# Patient Record
Sex: Female | Born: 1985 | Race: White | Hispanic: No | Marital: Married | Smoking: Never smoker
Health system: Southern US, Community
[De-identification: ages and names within clinical notes are randomized; demographics above are authoritative.]

## PROBLEM LIST (undated history)

## (undated) ENCOUNTER — Inpatient Hospital Stay (HOSPITAL_COMMUNITY): Payer: Self-pay

## (undated) DIAGNOSIS — Z789 Other specified health status: Secondary | ICD-10-CM

## (undated) DIAGNOSIS — I8390 Asymptomatic varicose veins of unspecified lower extremity: Secondary | ICD-10-CM

## (undated) DIAGNOSIS — Z8619 Personal history of other infectious and parasitic diseases: Secondary | ICD-10-CM

## (undated) HISTORY — DX: Asymptomatic varicose veins of unspecified lower extremity: I83.90

## (undated) HISTORY — PX: NO PAST SURGERIES: SHX2092

## (undated) HISTORY — DX: Personal history of other infectious and parasitic diseases: Z86.19

## (undated) HISTORY — PX: OTHER SURGICAL HISTORY: SHX169

---

## 2011-08-05 LAB — OB RESULTS CONSOLE ABO/RH

## 2011-08-05 LAB — OB RESULTS CONSOLE GC/CHLAMYDIA: Gonorrhea: NEGATIVE

## 2011-08-05 LAB — OB RESULTS CONSOLE HEPATITIS B SURFACE ANTIGEN: Hepatitis B Surface Ag: NEGATIVE

## 2011-08-05 LAB — OB RESULTS CONSOLE ANTIBODY SCREEN: Antibody Screen: NEGATIVE

## 2011-08-05 LAB — OB RESULTS CONSOLE RUBELLA ANTIBODY, IGM: Rubella: IMMUNE

## 2011-08-09 ENCOUNTER — Inpatient Hospital Stay (HOSPITAL_COMMUNITY)
Admission: AD | Admit: 2011-08-09 | Discharge: 2011-08-09 | Disposition: A | Discharge status: Home / Self Care | Payer: 59 | Source: Ambulatory Visit | Attending: Obstetrics and Gynecology | Admitting: Obstetrics and Gynecology

## 2011-08-09 ENCOUNTER — Encounter (HOSPITAL_COMMUNITY): Payer: Self-pay | Admitting: *Deleted

## 2011-08-09 ENCOUNTER — Inpatient Hospital Stay (HOSPITAL_COMMUNITY): Payer: 59

## 2011-08-09 DIAGNOSIS — O209 Hemorrhage in early pregnancy, unspecified: Secondary | ICD-10-CM | POA: Insufficient documentation

## 2011-08-09 HISTORY — DX: Other specified health status: Z78.9

## 2011-08-09 NOTE — MAU Note (Signed)
I woke up and my underwear was wet. Went to BR and filled the toilet up with blood. I had twins and I lost one. No pain

## 2011-08-09 NOTE — MAU Provider Note (Signed)
  History     CSN: [id number]  Arrival date and time: 08/09/11 6440   None     Chief Complaint  Patient presents with  . Vaginal Bleeding   HPI This is a 26 y.o. female at [redacted]w[redacted]d who presents with report of waking up this morning feeling wet. Went to bathroom and filled toilet with blood. Not much cramping, mild cramping just started. States originally had twins via In Vitro, but lost one at 8 weeks.   OB History    Grav Para Term Preterm Abortions TAB SAB Ect Mult Living   1               Past Medical History  Diagnosis Date  . No pertinent past medical history     No past surgical history on file.  No family history on file.  History  Substance Use Topics  . Smoking status: Never Smoker   . Smokeless tobacco: Not on file  . Alcohol Use: No    Allergies: Allergies not on file  No prescriptions prior to admission    ROS Denies fever, nausea or vomiting. + vaginal bleeding and cramping  Physical Exam   Blood pressure 121/59, pulse 99, temperature 98.5 F (36.9 C), temperature source Oral, resp. rate 20, height 5\' 3"  (1.6 m), weight 157 lb 9.6 oz (71.487 kg).  Physical Exam  Constitutional: She is oriented to person, place, and time. She appears well-developed and well-nourished. She appears distressed (worried).  Cardiovascular: Normal rate.   Respiratory: Effort normal.  GI: Soft. There is no tenderness.  Genitourinary: Vaginal discharge (spotting red blood, pelvic deferred per Dr Tenny Craw) found.  Musculoskeletal: Normal range of motion.  Neurological: She is alert and oriented to person, place, and time.  Skin: Skin is warm and dry.  Psychiatric: She has a normal mood and affect.   US Ob Comp Less 14 Wks  08/09/2011  *RADIOLOGY REPORT*  Clinical Data: Vaginal bleeding.  TWIN OBSTETRICAL ULTRASOUND <14 WKS:  Comparison:  None.  Findings:  A dichorionic twin IUP is seen.  TWIN A Heart Rate: 160 bpm  CRL:  48 mm       11w    5d         [place] EDC: 02/23/2012  TWIN  B Heart Rate: Absent  CRL:  21 mm       8w    5 d  Maternal uterus/adnexae: A small subchorionic hemorrhage is noted.  No fibroids identified.  Both ovaries are normal in appearance. No adnexal mass or free fluid identified.  IMPRESSION:  1.  Dichorionic twin IUP, with demise of twin B.  Single living IUP with estimated gestational age of [redacted] weeks 5 days and [place] EDC of 02/23/2012. 2.  Small subchronic hemorrhage. 3.  No adnexal mass or free fluid identified.  Original Report Authenticated By: Danae Orleans, M.D.   MAU Course  Procedures  Assessment and Plan  A:  Twin IUP at [redacted]w[redacted]d with known demise of Twin B       First Trimester bleeding       Small subchorionic hemorrhage P:  Discussed with Dr Tenny Craw      Pt reassured      Bleeding precautions      Followup in office as scheduled  [facility] 08/09/2011, 7:28 AM

## 2012-01-14 NOTE — L&D Delivery Note (Signed)
Patient was C/C/+2 and pushed for 20 minutes with epidural.   NSVD  female infant, Apgars 9,9, weight P.   The patient had one midline perineal partial 3rd degree laceration repaired with 2-0 vicryl R. Fundus was firm. EBL was expected. Placenta was delivered intact. Vagina was clear.  Baby was vigorous to bedside.  Miranda Hensley A

## 2012-02-24 ENCOUNTER — Encounter (HOSPITAL_COMMUNITY): Payer: Self-pay | Admitting: *Deleted

## 2012-02-24 ENCOUNTER — Inpatient Hospital Stay (HOSPITAL_COMMUNITY): Payer: 59 | Admitting: Anesthesiology

## 2012-02-24 ENCOUNTER — Inpatient Hospital Stay (HOSPITAL_COMMUNITY)
Admission: AD | Admit: 2012-02-24 | Discharge: 2012-02-26 | DRG: 775 | Disposition: A | Discharge status: Home / Self Care | Payer: 59 | Source: Ambulatory Visit | Attending: Obstetrics and Gynecology | Admitting: Obstetrics and Gynecology

## 2012-02-24 ENCOUNTER — Telehealth (HOSPITAL_COMMUNITY): Payer: Self-pay | Admitting: *Deleted

## 2012-02-24 ENCOUNTER — Encounter (HOSPITAL_COMMUNITY): Payer: Self-pay | Admitting: Anesthesiology

## 2012-02-24 DIAGNOSIS — O2200 Varicose veins of lower extremity in pregnancy, unspecified trimester: Secondary | ICD-10-CM | POA: Diagnosis present

## 2012-02-24 DIAGNOSIS — Z3480 Encounter for supervision of other normal pregnancy, unspecified trimester: Secondary | ICD-10-CM

## 2012-02-24 HISTORY — DX: Other specified health status: Z78.9

## 2012-02-24 LAB — CBC
HCT: 34.1 % — ABNORMAL LOW (ref 36.0–46.0)
MCH: 31.6 pg (ref 26.0–34.0)
MCHC: 34.3 g/dL (ref 30.0–36.0)
MCV: 92.2 fL (ref 78.0–100.0)
Platelets: 232 10*3/uL (ref 150–400)
RDW: 13.3 % (ref 11.5–15.5)
WBC: 13.4 10*3/uL — ABNORMAL HIGH (ref 4.0–10.5)

## 2012-02-24 LAB — COMPREHENSIVE METABOLIC PANEL
ALT: 11 U/L (ref 0–35)
AST: 16 U/L (ref 0–37)
Albumin: 2.8 g/dL — ABNORMAL LOW (ref 3.5–5.2)
Calcium: 9.6 mg/dL (ref 8.4–10.5)
Chloride: 99 mEq/L (ref 96–112)
Creatinine, Ser: 0.48 mg/dL — ABNORMAL LOW (ref 0.50–1.10)
Sodium: 135 mEq/L (ref 135–145)

## 2012-02-24 MED ORDER — OXYCODONE-ACETAMINOPHEN 5-325 MG PO TABS: 1.0000 | ORAL_TABLET | ORAL | Status: DC | PRN

## 2012-02-24 MED ORDER — IBUPROFEN 600 MG PO TABS: 600.0000 mg | ORAL_TABLET | Freq: Four times a day (QID) | ORAL | Status: DC | PRN

## 2012-02-24 MED ORDER — OXYTOCIN BOLUS FROM INFUSION: 500.0000 mL | INTRAVENOUS | Status: DC

## 2012-02-24 MED ORDER — LIDOCAINE HCL (PF) 1 % IJ SOLN
INTRAMUSCULAR | Status: DC | PRN
  Administered 2012-02-24 (×5): 4 mL

## 2012-02-24 MED ORDER — LACTATED RINGERS IV SOLN: 500.0000 mL | INTRAVENOUS | Status: DC | PRN

## 2012-02-24 MED ORDER — BUTORPHANOL TARTRATE 1 MG/ML IJ SOLN
1.0000 mg | INTRAMUSCULAR | Status: DC | PRN
  Filled 2012-02-24: qty 1

## 2012-02-24 MED ORDER — PHENYLEPHRINE 40 MCG/ML (10ML) SYRINGE FOR IV PUSH (FOR BLOOD PRESSURE SUPPORT): 80.0000 ug | PREFILLED_SYRINGE | INTRAVENOUS | Status: DC | PRN

## 2012-02-24 MED ORDER — DIPHENHYDRAMINE HCL 50 MG/ML IJ SOLN: 12.5000 mg | INTRAMUSCULAR | Status: DC | PRN

## 2012-02-24 MED ORDER — EPHEDRINE 5 MG/ML INJ: 10.0000 mg | INTRAVENOUS | Status: DC | PRN

## 2012-02-24 MED ORDER — LACTATED RINGERS IV SOLN
500.0000 mL | Freq: Once | INTRAVENOUS | Status: AC
  Administered 2012-02-24: 500 mL via INTRAVENOUS

## 2012-02-24 MED ORDER — OXYTOCIN 40 UNITS IN LACTATED RINGERS INFUSION - SIMPLE MED
62.5000 mL/h | INTRAVENOUS | Status: DC
  Administered 2012-02-24: 62.5 mL/h via INTRAVENOUS
  Filled 2012-02-24: qty 1000

## 2012-02-24 MED ORDER — SODIUM BICARBONATE 8.4 % IV SOLN
INTRAVENOUS | Status: DC | PRN
  Administered 2012-02-24: 5 mL via EPIDURAL

## 2012-02-24 MED ORDER — CITRIC ACID-SODIUM CITRATE 334-500 MG/5ML PO SOLN: 30.0000 mL | ORAL | Status: DC | PRN

## 2012-02-24 MED ORDER — LACTATED RINGERS IV SOLN
INTRAVENOUS | Status: DC
  Administered 2012-02-24 (×2): via INTRAVENOUS

## 2012-02-24 MED ORDER — EPHEDRINE 5 MG/ML INJ
10.0000 mg | INTRAVENOUS | Status: DC | PRN
  Filled 2012-02-24: qty 4

## 2012-02-24 MED ORDER — FENTANYL CITRATE 0.05 MG/ML IJ SOLN
INTRAMUSCULAR | Status: AC
  Administered 2012-02-24: 100 ug via EPIDURAL
  Filled 2012-02-24: qty 2

## 2012-02-24 MED ORDER — ACETAMINOPHEN 325 MG PO TABS: 650.0000 mg | ORAL_TABLET | ORAL | Status: DC | PRN

## 2012-02-24 MED ORDER — FENTANYL CITRATE 0.05 MG/ML IJ SOLN
INTRAMUSCULAR | Status: AC
  Filled 2012-02-24: qty 2

## 2012-02-24 MED ORDER — ONDANSETRON HCL 4 MG/2ML IJ SOLN: 4.0000 mg | Freq: Four times a day (QID) | INTRAMUSCULAR | Status: DC | PRN

## 2012-02-24 MED ORDER — LIDOCAINE HCL (PF) 1 % IJ SOLN
30.0000 mL | INTRAMUSCULAR | Status: DC | PRN
  Filled 2012-02-24: qty 30

## 2012-02-24 MED ORDER — FENTANYL 2.5 MCG/ML BUPIVACAINE 1/10 % EPIDURAL INFUSION (WH - ANES)
14.0000 mL/h | INTRAMUSCULAR | Status: DC
  Administered 2012-02-24: 14 mL/h via EPIDURAL
  Filled 2012-02-24: qty 125

## 2012-02-24 MED ORDER — PHENYLEPHRINE 40 MCG/ML (10ML) SYRINGE FOR IV PUSH (FOR BLOOD PRESSURE SUPPORT)
80.0000 ug | PREFILLED_SYRINGE | INTRAVENOUS | Status: DC | PRN
  Filled 2012-02-24: qty 5

## 2012-02-24 MED ORDER — FLEET ENEMA 7-19 GM/118ML RE ENEM: 1.0000 | ENEMA | RECTAL | Status: DC | PRN

## 2012-02-24 NOTE — Progress Notes (Signed)
Message left for Dr Henderson Cloud to return call to [facility] for report on labor ck

## 2012-02-24 NOTE — Telephone Encounter (Signed)
Preadmission screen Pt states she is having ucs and bleeding.  Told pt to come to MAU ASAP

## 2012-02-24 NOTE — H&P (Signed)
27 y.o. [redacted]w[redacted]d  G1P0 comes in c/o labor.  Otherwise has good fetal movement and no bleeding.  Past Medical History  Diagnosis Date  . No pertinent past medical history   . Newborn product of IVF pregnancy   . Hx of varicella   . Varicose veins   . Medical history non-contributory     Past Surgical History  Procedure Laterality Date  . No past surgeries    . Ivf      OB History   Grav Para Term Preterm Abortions TAB SAB Ect Mult Living   1              # Outc Date GA Lbr Len/2nd Wgt Sex Del Anes PTL Lv   1 CUR               History   Social History  . Marital Status: Married    Spouse Name: N/Hensley    Number of Children: N/Hensley  . Years of Education: N/Hensley   Occupational History  . Not on file.   Social History Main Topics  . Smoking status: Never Smoker   . Smokeless tobacco: Not on file  . Alcohol Use: No  . Drug Use: No  . Sexually Active: No   Other Topics Concern  . Not on file   Social History Narrative  . No narrative on file   Amoxicillin and Cephalexin    Prenatal Transfer Tool  Maternal Diabetes: No Genetic Screening: Declined Maternal Ultrasounds/Referrals: Abnormal:  Findings:   Other:twin preg initially- B demise at 9 weeks Fetal Ultrasounds or other Referrals:  None Maternal Substance Abuse:  No Significant Maternal Medications:  None Significant Maternal Lab Results: None  Other ZOX:WRUE    Filed Vitals:   02/24/12 1833  BP:   Pulse: 90  Temp:   Resp:      Lungs/Cor:  NAD Abdomen:  soft, gravid Ex:  no cords, erythema SVE:  5-6/80/-2, AROM clear FHTs:  120, good STV, NST R Toco:  q2   Hensley/P   Labor.  GBS neg.  Miranda Hensley

## 2012-02-24 NOTE — Progress Notes (Signed)
efm applied and assessing 

## 2012-02-24 NOTE — Anesthesia Preprocedure Evaluation (Signed)

## 2012-02-24 NOTE — Progress Notes (Signed)
Report called to [facility] in [facility].

## 2012-02-24 NOTE — Progress Notes (Signed)
No blood noted on perineum now. Pt states only sees bloody mucous when she wipes

## 2012-02-24 NOTE — Progress Notes (Signed)
Some pedal edema but not extreme

## 2012-02-24 NOTE — Anesthesia Procedure Notes (Signed)
Epidural Patient location during procedure: OB Start time: 02/24/2012 5:59 PM  Staffing Performed by: anesthesiologist   Preanesthetic Checklist Completed: patient identified, site marked, surgical consent, pre-op evaluation, timeout performed, IV checked, risks and benefits discussed and monitors and equipment checked  Epidural Patient position: sitting Prep: site prepped and draped and DuraPrep Patient monitoring: continuous pulse ox and blood pressure Approach: midline Injection technique: LOR air  Needle:  Needle type: Tuohy  Needle gauge: 17 G Needle length: 9 cm and 9 Needle insertion depth: 5 cm cm Catheter type: closed end flexible Catheter size: 19 Gauge Catheter at skin depth: 10 cm Test dose: negative  Assessment Events: blood not aspirated, injection not painful, no injection resistance, negative IV test and no paresthesia  Additional Notes Discussed risk of headache, infection, bleeding, nerve injury and failed or incomplete block.  Patient voices understanding and wishes to proceed.  Epidural placed easily on first attempt.  No paresthesia.  Patient tolerated procedure well with no apparent complications.  Jasmine December, MD Reason for block:procedure for pain

## 2012-02-24 NOTE — MAU Note (Signed)
Contractions since last pm. Able to sleep until 0300. Had some vag bleeding about 0930 today. Was first mucous and then a blob and now back to mucous.

## 2012-02-24 NOTE — Progress Notes (Signed)
Dr Henderson Cloud notified of pt's admission and status. Aware of ctx pattern, sve, elevated B/Ps. Will admit to [facility]

## 2012-02-25 ENCOUNTER — Encounter (HOSPITAL_COMMUNITY): Payer: Self-pay | Admitting: *Deleted

## 2012-02-25 LAB — CBC
MCH: 32.4 pg (ref 26.0–34.0)
MCHC: 34.9 g/dL (ref 30.0–36.0)
Platelets: 196 10*3/uL (ref 150–400)
RBC: 2.81 MIL/uL — ABNORMAL LOW (ref 3.87–5.11)

## 2012-02-25 MED ORDER — INFLUENZA VIRUS VACC SPLIT PF IM SUSP: 0.5000 mL | INTRAMUSCULAR | Status: DC

## 2012-02-25 MED ORDER — SENNOSIDES-DOCUSATE SODIUM 8.6-50 MG PO TABS
2.0000 | ORAL_TABLET | Freq: Every day | ORAL | Status: DC
  Administered 2012-02-25 (×2): 2 via ORAL

## 2012-02-25 MED ORDER — SODIUM CHLORIDE 0.9 % IJ SOLN
3.0000 mL | Freq: Two times a day (BID) | INTRAMUSCULAR | Status: DC
  Administered 2012-02-25: 3 mL via INTRAVENOUS

## 2012-02-25 MED ORDER — SODIUM CHLORIDE 0.9 % IV SOLN: 250.0000 mL | INTRAVENOUS | Status: DC | PRN

## 2012-02-25 MED ORDER — ONDANSETRON HCL 4 MG/2ML IJ SOLN: 4.0000 mg | INTRAMUSCULAR | Status: DC | PRN

## 2012-02-25 MED ORDER — FERROUS SULFATE 325 (65 FE) MG PO TABS
325.0000 mg | ORAL_TABLET | Freq: Two times a day (BID) | ORAL | Status: DC
  Administered 2012-02-25 – 2012-02-26 (×3): 325 mg via ORAL
  Filled 2012-02-25 (×3): qty 1

## 2012-02-25 MED ORDER — WITCH HAZEL-GLYCERIN EX PADS
1.0000 "application " | MEDICATED_PAD | CUTANEOUS | Status: DC | PRN
  Administered 2012-02-25 – 2012-02-26 (×2): 1 via TOPICAL

## 2012-02-25 MED ORDER — MEASLES, MUMPS & RUBELLA VAC ~~LOC~~ INJ
0.5000 mL | INJECTION | Freq: Once | SUBCUTANEOUS | Status: DC
  Filled 2012-02-25: qty 0.5

## 2012-02-25 MED ORDER — BENZOCAINE-MENTHOL 20-0.5 % EX AERO
1.0000 "application " | INHALATION_SPRAY | CUTANEOUS | Status: DC | PRN
  Administered 2012-02-25 – 2012-02-26 (×2): 1 via TOPICAL
  Filled 2012-02-25 (×2): qty 56

## 2012-02-25 MED ORDER — LANOLIN HYDROUS EX OINT: TOPICAL_OINTMENT | CUTANEOUS | Status: DC | PRN

## 2012-02-25 MED ORDER — ONDANSETRON HCL 4 MG PO TABS: 4.0000 mg | ORAL_TABLET | ORAL | Status: DC | PRN

## 2012-02-25 MED ORDER — OXYCODONE-ACETAMINOPHEN 5-325 MG PO TABS
1.0000 | ORAL_TABLET | ORAL | Status: DC | PRN
  Administered 2012-02-25 – 2012-02-26 (×4): 1 via ORAL
  Filled 2012-02-25 (×4): qty 1

## 2012-02-25 MED ORDER — MAGNESIUM HYDROXIDE 400 MG/5ML PO SUSP: 30.0000 mL | ORAL | Status: DC | PRN

## 2012-02-25 MED ORDER — SODIUM CHLORIDE 0.9 % IJ SOLN: 3.0000 mL | INTRAMUSCULAR | Status: DC | PRN

## 2012-02-25 MED ORDER — SIMETHICONE 80 MG PO CHEW: 80.0000 mg | CHEWABLE_TABLET | ORAL | Status: DC | PRN

## 2012-02-25 MED ORDER — IBUPROFEN 800 MG PO TABS
800.0000 mg | ORAL_TABLET | Freq: Three times a day (TID) | ORAL | Status: DC
  Administered 2012-02-25 (×2): 800 mg via ORAL
  Administered 2012-02-26: 600 mg via ORAL
  Administered 2012-02-26: 800 mg via ORAL
  Filled 2012-02-25 (×4): qty 1

## 2012-02-25 MED ORDER — PRENATAL MULTIVITAMIN CH
1.0000 | ORAL_TABLET | Freq: Every day | ORAL | Status: DC
  Administered 2012-02-25 – 2012-02-26 (×2): 1 via ORAL
  Filled 2012-02-25 (×2): qty 1

## 2012-02-25 MED ORDER — METHYLERGONOVINE MALEATE 0.2 MG/ML IJ SOLN: 0.2000 mg | INTRAMUSCULAR | Status: DC | PRN

## 2012-02-25 MED ORDER — DIPHENHYDRAMINE HCL 25 MG PO CAPS: 25.0000 mg | ORAL_CAPSULE | Freq: Four times a day (QID) | ORAL | Status: DC | PRN

## 2012-02-25 MED ORDER — TETANUS-DIPHTH-ACELL PERTUSSIS 5-2.5-18.5 LF-MCG/0.5 IM SUSP
0.5000 mL | Freq: Once | INTRAMUSCULAR | Status: AC
  Administered 2012-02-25: 0.5 mL via INTRAMUSCULAR
  Filled 2012-02-25: qty 0.5

## 2012-02-25 MED ORDER — ZOLPIDEM TARTRATE 5 MG PO TABS: 5.0000 mg | ORAL_TABLET | Freq: Every evening | ORAL | Status: DC | PRN

## 2012-02-25 MED ORDER — DIBUCAINE 1 % RE OINT
1.0000 "application " | TOPICAL_OINTMENT | RECTAL | Status: DC | PRN
  Administered 2012-02-25 – 2012-02-26 (×2): 1 via RECTAL
  Filled 2012-02-25 (×2): qty 28

## 2012-02-25 MED ORDER — METHYLERGONOVINE MALEATE 0.2 MG PO TABS: 0.2000 mg | ORAL_TABLET | ORAL | Status: DC | PRN

## 2012-02-25 NOTE — Anesthesia Postprocedure Evaluation (Signed)
  Anesthesia Post-op Note  Patient: Miranda Hensley  Procedure(s) Performed: * No procedures listed *  Patient Location: Mother/Baby  Anesthesia Type:Epidural  Level of Consciousness: awake, alert  and oriented  Airway and Oxygen Therapy: Patient Spontanous Breathing  Post-op Pain: mild  Post-op Assessment: Post-op Vital signs reviewed, Patient's Cardiovascular Status Stable, No headache, No backache, No residual numbness and No residual motor weakness  Post-op Vital Signs: Reviewed and stable  Complications: No apparent anesthesia complications

## 2012-02-25 NOTE — Progress Notes (Signed)
Patient is eating, ambulating, voiding.  Pain control is good.  Appropriate lochia.  No complaints.  Filed Vitals:   02/25/12 0016 02/25/12 0054 02/25/12 0200 02/25/12 0627  BP: 131/62 107/75 119/72 103/61  Pulse: 116 109 110 98  Temp:  100 F (37.8 C) 99 F (37.2 C) 98.3 F (36.8 C)  TempSrc:  Oral Oral Oral  Resp: 20 18 18 18   Height:      Weight:      SpO2:  98% 97% 97%    Fundus firm Perineum without swelling. No CT  Lab Results  Component Value Date   WBC 17.7* 02/25/2012   HGB 9.1* 02/25/2012   HCT 26.1* 02/25/2012   MCV 92.9 02/25/2012   PLT 196 02/25/2012    A/Positive/-- (07/23 0000)  A/P Post partum day 1.  Routine care.  Expect d/c 2/13.    Philip Aspen

## 2012-02-26 ENCOUNTER — Telehealth (HOSPITAL_COMMUNITY): Payer: Self-pay | Admitting: *Deleted

## 2012-02-26 MED ORDER — OXYCODONE-ACETAMINOPHEN 5-325 MG PO TABS: 1.0000 | ORAL_TABLET | ORAL | Status: DC | PRN

## 2012-02-26 MED ORDER — FERROUS SULFATE 325 (65 FE) MG PO TABS: 325.0000 mg | ORAL_TABLET | Freq: Two times a day (BID) | ORAL | Status: DC

## 2012-02-26 NOTE — Anesthesia Postprocedure Evaluation (Signed)
  Anesthesia Post-op Note  Asked by Dr Dareen Piano to evaluate patient for complaint of numb/weak right leg following SVD with epidural on 02/24/12.  Patient is being discharged home today.  She had epidural for labor on 02/24/12.  During labor, she had pain in right hip (suspected it was bone-on-bone pain from baby's position, did not respond to position changes or PCEAs, but did improve with epidural fentanyl and 10 ml 2% lidocaine).  She eventually became complete and pushed for 15 minutes.  Today, she is preparing to go home.  When I came to evaluate her she is standing and walking around her room.  She reports numbness and weakness of her right anterior thigh only.  She is able to ambulate normally.  She has no bowel or bladder dysfunction.  Left leg is unaffected.  Right posterior thigh and lower leg are unaffected.  I reassured her that this is likely femoral nerve compression from labor/birth and will resolve over the next few days/ weeks.  There is no need for further intervention.  Patient satisfied with explanation and anesthesia care.  Jasmine December, MD

## 2012-02-26 NOTE — Progress Notes (Signed)
PPD#2 Pt without c/o. Lochia-mild VSSAF IMP/ stable Plan/ Will discharge.

## 2012-02-26 NOTE — Discharge Summary (Signed)
Obstetric Discharge Summary Reason for Admission: onset of labor Prenatal Procedures: ultrasound Intrapartum Procedures: spontaneous vaginal delivery Postpartum Procedures: none Complications-Operative and Postpartum: 3 degree perineal laceration Hemoglobin  Date Value Range Status  02/25/2012 9.1* 12.0 - 15.0 g/dL Final     DELTA CHECK NOTED     REPEATED TO VERIFY     HCT  Date Value Range Status  02/25/2012 26.1* 36.0 - 46.0 % Final    Physical Exam:  General: alert Lochia: appropriate Uterine Fundus: firm   Discharge Diagnoses: Term Pregnancy-delivered  Discharge Information: Date: 02/26/2012 Activity: pelvic rest Diet: routine Medications: PNV, Ibuprofen, Colace, Iron and Percocet Condition: stable Instructions: refer to practice specific booklet Discharge to: home Follow-up Information   Follow up with HORVATH,MICHELLE A, MD. Schedule an appointment as soon as possible for a visit in 4 weeks.   Contact information:   [address] [place] [address] [phone]       Newborn Data: Live born female  Birth Weight: 7 lb 11.5 oz (3500 g) APGAR: 9, 9  Home with mother.  Yusuf Yu E 02/26/2012, 7:39 AM

## 2012-02-26 NOTE — Telephone Encounter (Signed)
Resolve episode 

## 2012-03-01 ENCOUNTER — Inpatient Hospital Stay (HOSPITAL_COMMUNITY): Admission: RE | Admit: 2012-03-01 | Payer: 59 | Source: Ambulatory Visit

## 2013-11-14 ENCOUNTER — Encounter (HOSPITAL_COMMUNITY): Payer: Self-pay | Admitting: *Deleted

## 2013-11-26 ENCOUNTER — Inpatient Hospital Stay (HOSPITAL_COMMUNITY): Payer: 59

## 2013-11-26 ENCOUNTER — Ambulatory Visit (HOSPITAL_COMMUNITY)
Admission: AD | Admit: 2013-11-26 | Discharge: 2013-11-26 | Disposition: A | Discharge status: Home / Self Care | Payer: 59 | Source: Ambulatory Visit | Attending: Obstetrics | Admitting: Obstetrics

## 2013-11-26 ENCOUNTER — Encounter (HOSPITAL_COMMUNITY): Payer: Self-pay | Admitting: *Deleted

## 2013-11-26 ENCOUNTER — Encounter (HOSPITAL_COMMUNITY)
Admission: AD | Disposition: A | Discharge status: Home / Self Care | Payer: Self-pay | Source: Ambulatory Visit | Attending: Obstetrics

## 2013-11-26 DIAGNOSIS — M25512 Pain in left shoulder: Secondary | ICD-10-CM | POA: Insufficient documentation

## 2013-11-26 DIAGNOSIS — O001 Tubal pregnancy: Secondary | ICD-10-CM | POA: Insufficient documentation

## 2013-11-26 DIAGNOSIS — R102 Pelvic and perineal pain: Secondary | ICD-10-CM

## 2013-11-26 DIAGNOSIS — O26891 Other specified pregnancy related conditions, first trimester: Secondary | ICD-10-CM

## 2013-11-26 HISTORY — PX: DIAGNOSTIC LAPAROSCOPY WITH REMOVAL OF ECTOPIC PREGNANCY: SHX6449

## 2013-11-26 LAB — URINALYSIS, ROUTINE W REFLEX MICROSCOPIC
BILIRUBIN URINE: NEGATIVE
GLUCOSE, UA: NEGATIVE mg/dL
Ketones, ur: NEGATIVE mg/dL
Nitrite: NEGATIVE
Protein, ur: NEGATIVE mg/dL
SPECIFIC GRAVITY, URINE: 1.01 (ref 1.005–1.030)
UROBILINOGEN UA: 0.2 mg/dL (ref 0.0–1.0)
pH: 6.5 (ref 5.0–8.0)

## 2013-11-26 LAB — CBC
HEMATOCRIT: 35.6 % — AB (ref 36.0–46.0)
HEMOGLOBIN: 12.6 g/dL (ref 12.0–15.0)
MCH: 31.6 pg (ref 26.0–34.0)
MCHC: 35.4 g/dL (ref 30.0–36.0)
MCV: 89.2 fL (ref 78.0–100.0)
Platelets: 288 10*3/uL (ref 150–400)
RBC: 3.99 MIL/uL (ref 3.87–5.11)
RDW: 12.2 % (ref 11.5–15.5)
WBC: 11.1 10*3/uL — AB (ref 4.0–10.5)

## 2013-11-26 LAB — ABO/RH: ABO/RH(D): A POS

## 2013-11-26 LAB — TYPE AND SCREEN
ABO/RH(D): A POS
Antibody Screen: NEGATIVE

## 2013-11-26 LAB — HCG, QUANTITATIVE, PREGNANCY: hCG, Beta Chain, Quant, S: 82447 m[IU]/mL — ABNORMAL HIGH (ref <5)

## 2013-11-26 LAB — URINE MICROSCOPIC-ADD ON

## 2013-11-26 LAB — POCT PREGNANCY, URINE: PREG TEST UR: POSITIVE — AB

## 2013-11-26 SURGERY — LAPAROSCOPY, WITH ECTOPIC PREGNANCY SURGICAL TREATMENT
Anesthesia: General | Site: Abdomen

## 2013-11-26 MED ORDER — ONDANSETRON HCL 4 MG/2ML IJ SOLN
INTRAMUSCULAR | Status: AC
  Filled 2013-11-26: qty 2

## 2013-11-26 MED ORDER — FENTANYL CITRATE 0.05 MG/ML IJ SOLN
INTRAMUSCULAR | Status: DC | PRN
Start: 2013-11-26 — End: 2013-11-26
  Administered 2013-11-26: 50 ug via INTRAVENOUS
  Administered 2013-11-26 (×2): 100 ug via INTRAVENOUS

## 2013-11-26 MED ORDER — OXYCODONE HCL 5 MG PO TABS: 5.0000 mg | ORAL_TABLET | Freq: Once | ORAL | Status: DC | PRN

## 2013-11-26 MED ORDER — LIDOCAINE HCL (CARDIAC) 20 MG/ML IV SOLN
INTRAVENOUS | Status: DC | PRN
  Administered 2013-11-26: 20 mg via INTRAVENOUS

## 2013-11-26 MED ORDER — ROCURONIUM BROMIDE 100 MG/10ML IV SOLN
INTRAVENOUS | Status: AC
  Filled 2013-11-26: qty 1

## 2013-11-26 MED ORDER — ACETAMINOPHEN 10 MG/ML IV SOLN
1000.0000 mg | Freq: Four times a day (QID) | INTRAVENOUS | Status: DC
  Administered 2013-11-26: 1000 mg via INTRAVENOUS
  Filled 2013-11-26: qty 100

## 2013-11-26 MED ORDER — OXYCODONE HCL 5 MG PO TABS: 5.0000 mg | ORAL_TABLET | Freq: Four times a day (QID) | ORAL | Status: DC | PRN

## 2013-11-26 MED ORDER — HYDROMORPHONE HCL 1 MG/ML IJ SOLN
0.2500 mg | INTRAMUSCULAR | Status: DC | PRN
  Administered 2013-11-26: 0.25 mg via INTRAVENOUS

## 2013-11-26 MED ORDER — LACTATED RINGERS IV SOLN
INTRAVENOUS | Status: DC | PRN
  Administered 2013-11-26 (×2): via INTRAVENOUS

## 2013-11-26 MED ORDER — PROPOFOL 10 MG/ML IV BOLUS
INTRAVENOUS | Status: DC | PRN
  Administered 2013-11-26: 200 mg via INTRAVENOUS

## 2013-11-26 MED ORDER — SUCCINYLCHOLINE CHLORIDE 20 MG/ML IJ SOLN
INTRAMUSCULAR | Status: DC | PRN
Start: 2013-11-26 — End: 2013-11-26
  Administered 2013-11-26: 100 mg via INTRAVENOUS

## 2013-11-26 MED ORDER — GLYCOPYRROLATE 0.2 MG/ML IJ SOLN
INTRAMUSCULAR | Status: AC
  Filled 2013-11-26: qty 4

## 2013-11-26 MED ORDER — GLYCOPYRROLATE 0.2 MG/ML IJ SOLN
INTRAMUSCULAR | Status: DC | PRN
  Administered 2013-11-26: .8 mg via INTRAVENOUS

## 2013-11-26 MED ORDER — ONDANSETRON HCL 4 MG/2ML IJ SOLN
INTRAMUSCULAR | Status: DC | PRN
  Administered 2013-11-26: 4 mg via INTRAVENOUS

## 2013-11-26 MED ORDER — ROCURONIUM BROMIDE 100 MG/10ML IV SOLN
INTRAVENOUS | Status: DC | PRN
Start: 2013-11-26 — End: 2013-11-26
  Administered 2013-11-26: 10 mg via INTRAVENOUS
  Administered 2013-11-26: 20 mg via INTRAVENOUS

## 2013-11-26 MED ORDER — NEOSTIGMINE METHYLSULFATE 10 MG/10ML IV SOLN
INTRAVENOUS | Status: AC
  Filled 2013-11-26: qty 1

## 2013-11-26 MED ORDER — PROMETHAZINE HCL 25 MG/ML IJ SOLN: 6.2500 mg | INTRAMUSCULAR | Status: DC | PRN

## 2013-11-26 MED ORDER — SUCCINYLCHOLINE CHLORIDE 20 MG/ML IJ SOLN
INTRAMUSCULAR | Status: AC
  Filled 2013-11-26: qty 10

## 2013-11-26 MED ORDER — BUPIVACAINE HCL 0.25 % IJ SOLN
INTRAMUSCULAR | Status: DC | PRN
  Administered 2013-11-26: 9 mL

## 2013-11-26 MED ORDER — HYDROMORPHONE HCL 1 MG/ML IJ SOLN
INTRAMUSCULAR | Status: AC
  Administered 2013-11-26: 0.25 mg via INTRAVENOUS
  Filled 2013-11-26: qty 1

## 2013-11-26 MED ORDER — OXYCODONE HCL 5 MG/5ML PO SOLN: 5.0000 mg | Freq: Once | ORAL | Status: DC | PRN

## 2013-11-26 MED ORDER — CITRIC ACID-SODIUM CITRATE 334-500 MG/5ML PO SOLN
30.0000 mL | Freq: Once | ORAL | Status: AC
  Administered 2013-11-26: 30 mL via ORAL
  Filled 2013-11-26: qty 15

## 2013-11-26 MED ORDER — PROMETHAZINE HCL 12.5 MG PO TABS: ORAL_TABLET | ORAL | Status: DC

## 2013-11-26 MED ORDER — FAMOTIDINE IN NACL 20-0.9 MG/50ML-% IV SOLN
20.0000 mg | Freq: Once | INTRAVENOUS | Status: AC
Start: 2013-11-26 — End: 2013-11-26
  Administered 2013-11-26: 20 mg via INTRAVENOUS
  Filled 2013-11-26: qty 50

## 2013-11-26 MED ORDER — PROPOFOL 10 MG/ML IV EMUL
INTRAVENOUS | Status: AC
  Filled 2013-11-26: qty 20

## 2013-11-26 MED ORDER — THROMBIN 5000 UNITS EX SOLR
CUTANEOUS | Status: AC
  Filled 2013-11-26: qty 5000

## 2013-11-26 MED ORDER — NEOSTIGMINE METHYLSULFATE 10 MG/10ML IV SOLN
INTRAVENOUS | Status: DC | PRN
  Administered 2013-11-26: 4 mg via INTRAVENOUS

## 2013-11-26 MED ORDER — MEPERIDINE HCL 25 MG/ML IJ SOLN: 6.2500 mg | INTRAMUSCULAR | Status: DC | PRN

## 2013-11-26 MED ORDER — FENTANYL CITRATE 0.05 MG/ML IJ SOLN
INTRAMUSCULAR | Status: AC
  Filled 2013-11-26: qty 5

## 2013-11-26 MED ORDER — BUPIVACAINE HCL (PF) 0.25 % IJ SOLN
INTRAMUSCULAR | Status: AC
  Filled 2013-11-26: qty 30

## 2013-11-26 MED ORDER — LIDOCAINE HCL (CARDIAC) 20 MG/ML IV SOLN
INTRAVENOUS | Status: AC
  Filled 2013-11-26: qty 5

## 2013-11-26 SURGICAL SUPPLY — 27 items
APPLICATOR SURGIFLO (MISCELLANEOUS) ×3 IMPLANT
BLADE SURG 11 STRL SS (BLADE) ×3 IMPLANT
CATH FOLEY 2WAY SLVR  5CC 16FR (CATHETERS) ×2
CATH FOLEY 2WAY SLVR 5CC 16FR (CATHETERS) ×1 IMPLANT
DRAPE LAPAROSCOPIC ABDOMINAL (DRAPES) ×3 IMPLANT
DRSG OPSITE POSTOP 3X4 (GAUZE/BANDAGES/DRESSINGS) ×3 IMPLANT
EVACUATOR SMOKE 8.L (FILTER) ×3 IMPLANT
GLOVE BIO SURGEON STRL SZ 6 (GLOVE) ×3 IMPLANT
GLOVE BIO SURGEON STRL SZ 6.5 (GLOVE) ×2 IMPLANT
GLOVE BIO SURGEONS STRL SZ 6.5 (GLOVE) ×1
GLOVE BIOGEL PI IND STRL 6.5 (GLOVE) ×2 IMPLANT
GLOVE BIOGEL PI IND STRL 7.0 (GLOVE) ×3 IMPLANT
GLOVE BIOGEL PI INDICATOR 6.5 (GLOVE) ×4
GLOVE BIOGEL PI INDICATOR 7.0 (GLOVE) ×6
GLOVE SURG SS PI 7.0 STRL IVOR (GLOVE) ×3 IMPLANT
GOWN SURGICAL LARGE (GOWNS) ×9 IMPLANT
LIGASURE 5MM LAPAROSCOPIC (INSTRUMENTS) ×3 IMPLANT
LIQUID BAND (GAUZE/BANDAGES/DRESSINGS) ×3 IMPLANT
POUCH SPECIMEN RETRIEVAL 10MM (ENDOMECHANICALS) ×3 IMPLANT
PROTECTOR NERVE ULNAR (MISCELLANEOUS) ×6 IMPLANT
SET IRRIG TUBING LAPAROSCOPIC (IRRIGATION / IRRIGATOR) ×3 IMPLANT
SURGIFLO W/THROMBIN 8M KIT (HEMOSTASIS) ×3 IMPLANT
SUT VICRYL 0 UR6 27IN ABS (SUTURE) ×3 IMPLANT
SUT VICRYL RAPIDE 3 0 (SUTURE) ×3 IMPLANT
TROCAR XCEL 12X100 BLDLESS (ENDOMECHANICALS) ×3 IMPLANT
TROCAR XCEL NON-BLD 5MMX100MML (ENDOMECHANICALS) ×6 IMPLANT
WARMER LAPAROSCOPE (MISCELLANEOUS) ×3 IMPLANT

## 2013-11-26 NOTE — H&P (Addendum)
28 y.o. G2P1001 @ 8150w3d by IVF transfer presents w c/o left pelvic pain and sudden onset severe left shoulder pain.  She is a patient of Dr. Meridee Scoreeaton's.  She had a FET of two embryos resulting in what was thought to be a singleton IUP.  She had some heavy vaginal bleeding last week, now only with intermittent spotting.  Yesterday, she had an episode of severe pelvic pain and dizziness.  She was seen by Dr. Elesa Hackereaton, who saw a suspicious mass in the left adnexa and blood in the cul de sac.  Her symptoms improved, so she was sent home for expectant management.  Today, she notes consistent dull LLQ pain as well as left shoulder pain. TVUS today revealed a viable IUP c/w dates.  Surrounding the left ovary is a large heterogeneous left adnexal mass with soft tissue density and fluid density that measures 7.3 x 4.4 x 4.7 cm. No definite vascular flow seen within this left adnexal mass. Additionally, there is complex free fluid in the cul-de-sac concerning for a left ectopic pregnancy.  Hemoglobin is 12.6     Past Medical History  Diagnosis Date  . No pertinent past medical history   . Newborn product of IVF pregnancy   . Hx of varicella   . Varicose veins   . Medical history non-contributory     Past Surgical History  Procedure Laterality Date  . No past surgeries    . Ivf      OB History  Gravida Para Term Preterm AB SAB TAB Ectopic Multiple Living  2 1 1       1     # Outcome Date GA Lbr Len/2nd Weight Sex Delivery Anes PTL Lv  2 Current           1 Term 02/24/12 1630w1d 13:31 / 01:57 3.5 kg (7 lb 11.5 oz) F Vag-Spont EPI  Y     Comments: none      History   Social History  . Marital Status: Married    Spouse Name: N/A    Number of Children: N/A  . Years of Education: N/A   Occupational History  . Not on file.   Social History Main Topics  . Smoking status: Never Smoker   . Smokeless tobacco: Not on file  . Alcohol Use: No  . Drug Use: No  . Sexual Activity: No   Other Topics  Concern  . Not on file   Social History Narrative   Amoxicillin and Cephalexin        Filed Vitals:   11/26/13 0956  BP: 146/70  Pulse: 92  Temp: 98.1 F (36.7 C)  Resp: 18     General:  NAD Lungs: CTAB Cardiac: RRR Abdomen:  soft, ttp in llq, no rebound or guarding.  Bimanual examine: uterus enlarge, c/w 7-[redacted]wk gestation, fullness in the left adnexa and cul de sac, no blood noted on glove   A/P   28 y.o. G2P1001 @ 2150w3d by IVF transfer presents with a likely heterotopic pregnancy.  The complex fluid in the cul de sac is concerning for blood/clot from rupture, though Baird LyonsCasey is clinically stable.  At this time, given the high concern of a left adnexal ectopic, I recommend proceeding to the operating room for a diagnostic laparoscopy, possible salpingectomy, possible oophorectomy.  The significant amount of free fluid in the cul de sac is concerning for blood from a ruptured ectopic, and I do not feel that expectant management is a safe option at  this time. Reviewed in detail the r/b/a including risk of infection, bleeding requiring blood transfusion, damage to surrounding structures, including bowel, bladder, tubes, ovaries, need for additional procedures. I discussed that we do not think that general anesthesia in the first trimester increases the risk of birth defects or miscarriage. At 7 weeks, it is still possible that this pregnancy may result in a miscarriage, and it will be impossible to know if this surgical procedure was a direct cause. Will avoid intrauterine/cervical manipulation during the procedure.    [place], [facility]NA

## 2013-11-26 NOTE — Discharge Instructions (Signed)
Diagnostic Laparoscopy Laparoscopy is a surgical procedure. It is used to diagnose and treat diseases inside the belly (abdomen). It is usually a brief, common, and relatively simple procedure. The laparoscopeis a thin, lighted, pencil-sized instrument. It is like a telescope. It is inserted into your abdomen through a small cut (incision). Your caregiver can look at the organs inside your body through this instrument. He or she can see if there is anything abnormal. Laparoscopy can be done either in a hospital or outpatient clinic. You may be given a mild sedative to help you relax before the procedure. Once in the operating room, you will be given a drug to make you sleep (general anesthesia). Laparoscopy usually lasts less than 1 hour. After the procedure, you will be monitored in a recovery area until you are stable and doing well. Once you are home, it will take 2 to 3 days to fully recover. RISKS AND COMPLICATIONS  Laparoscopy has relatively few risks. Your caregiver will discuss the risks with you before the procedure. Some problems that can occur include:  Infection.  Bleeding.  Damage to other organs.  Anesthetic side effects. PROCEDURE Once you receive anesthesia, your surgeon inflates the abdomen with a harmless gas (carbon dioxide). This makes the organs easier to see. The laparoscope is inserted into the abdomen through a small incision. This allows your surgeon to see into the abdomen. Other small instruments are also inserted into the abdomen through other small openings. Many surgeons attach a video camera to the laparoscope to enlarge the view. During a diagnostic laparoscopy, the surgeon may be looking for inflammation, infection, or cancer. Your surgeon may take tissue samples(biopsies). The samples are sent to a specialist in looking at cells and tissue samples (pathologist). The pathologist examines them under a microscope. Biopsies can help to diagnose or confirm a  disease.  AFTER THE PROCEDURE   The gas is released from inside the abdomen.  The incisions are closed with stitches (sutures). Because these incisions are small (usually less than 1/2 inch), there is usually minimal discomfort after the procedure. There may be some mild discomfort in the throat. This is from the tube placed in the throat while you were sleeping. You may have some mild abdominal discomfort. There may also be discomfort from the instrument placement incisions in the abdomen.  The recovery time is shortened as long as there are no complications.  You will rest in a recovery room until stable and doing well. As long as there are no complications, you may be allowed to go home.  HOME CARE INSTRUCTIONS   Take all medicines as directed.  Only take over-the-counter or prescription medicines for pain, discomfort, or fever as directed by your caregiver.  Resume daily activities as directed.  Showers are preferred over baths.  You may resume sexual activities in 1 week or as directed.  Do not drive while taking narcotics. SEEK MEDICAL CARE IF:   There is increasing abdominal pain.  There is new pain in the shoulders (shoulder strap areas).  You feel lightheaded or faint.  You have the chills.  You or your child has an oral temperature above 102 F (38.9 C).  There is pus-like (purulent) drainage from any of the wounds.  You are unable to pass gas or have a bowel movement.  You feel sick to your stomach (nauseous) or throw up (vomit). MAKE SURE YOU:   Understand these instructions.  Will watch your condition.  Will get help right away if you  are not doing well or get worse. Document Released: 04/07/2000 Document Revised: 04/26/2012 Document Reviewed: 12/30/2006 [facility] Patient Information 2015 [employer] [place]. This information is not intended to replace advice given to you by your health care provider. Make sure you discuss any questions you have with your  health care provider.

## 2013-11-26 NOTE — Anesthesia Preprocedure Evaluation (Signed)
Anesthesia Evaluation  Patient identified by MRN, date of birth, ID band Patient awake    Reviewed: Allergy & Precautions, H&P , NPO status , Patient's Chart, lab work & pertinent test results, reviewed documented beta blocker date and time   History of Anesthesia Complications Negative for: history of anesthetic complications  Airway Mallampati: III  TM Distance: >3 FB Neck ROM: full    Dental  (+) Teeth Intact   Pulmonary neg pulmonary ROS,  breath sounds clear to auscultation        Cardiovascular negative cardio ROS  Rhythm:regular Rate:Normal     Neuro/Psych negative neurological ROS  negative psych ROS   GI/Hepatic negative GI ROS, Neg liver ROS,   Endo/Other  negative endocrine ROS  Renal/GU negative Renal ROS     Musculoskeletal   Abdominal   Peds  Hematology negative hematology ROS (+)   Anesthesia Other Findings   Reproductive/Obstetrics (+) Pregnancy                             Anesthesia Physical  Anesthesia Plan  ASA: II and emergent  Anesthesia Plan: General   Post-op Pain Management:    Induction: Intravenous, Rapid sequence and Cricoid pressure planned  Airway Management Planned: Oral ETT  Additional Equipment: None  Intra-op Plan:   Post-operative Plan: Extubation in OR  Informed Consent: I have reviewed the patients History and Physical, chart, labs and discussed the procedure including the risks, benefits and alternatives for the proposed anesthesia with the patient or authorized representative who has indicated his/her understanding and acceptance.   Dental advisory given  Plan Discussed with: CRNA  Anesthesia Plan Comments:         Anesthesia Quick Evaluation

## 2013-11-26 NOTE — Anesthesia Postprocedure Evaluation (Signed)
Anesthesia Post Note  Patient: Miranda Hensley  Procedure(s) Performed: Procedure(s) (LRB): DIAGNOSTIC LAPAROSCOPY WITH REMOVAL OF ECTOPIC PREGNANCY (N/A)  Anesthesia type: General  Patient location: PACU  Post pain: Pain level controlled  Post assessment: Post-op Vital signs reviewed  Last Vitals: BP 109/59 mmHg  Pulse 76  Temp(Src) 37.2 C  Resp 19  Ht 5\' 4"  (1.626 m)  Wt 164 lb (74.39 kg)  BMI 28.14 kg/m2  SpO2 100%  Post vital signs: Reviewed  Level of consciousness: sedated  Complications: No apparent anesthesia complications

## 2013-11-26 NOTE — Progress Notes (Signed)
BSUS demonstrates normal FHR 160s post procedure.

## 2013-11-26 NOTE — MAU Provider Note (Signed)
History     CSN: [id number]  Arrival date and time: 11/26/13 0935   None     Chief Complaint  Patient presents with  . Shoulder Pain  . Abdominal Pain   HPI 28 y.o. G2P1001 at approx [redacted] weeks ega following IVF. Pt presents c/o L pelvic pain, which has been with this pregnancy and new onset L shoulder pain since last night. Had some heavy vaginal bleeding last week, now w/ intermittent spotting. Pt was seen in her OB's office yesterday and had u/s which she reports showed 8 week intrauterine pregnancy w/ cardiac activity and L adnexal mass concerning for ectopic pregnancy. She states they saw a "sac filled with fluid on the left side and my left ovary is huge" and was told to come in immediately w/ any increase in pain.   Past Medical History  Diagnosis Date  . No pertinent past medical history   . Newborn product of IVF pregnancy   . Hx of varicella   . Varicose veins   . Medical history non-contributory     Past Surgical History  Procedure Laterality Date  . No past surgeries    . Ivf      Family History  Problem Relation Age of Onset  . Heart disease Mother   . Hypertension Mother   . Anemia Mother   . Diabetes Mother   . Heart disease Brother   . Hypertension Brother   . Thyroid disease Maternal Grandmother   . Cancer Maternal Grandmother     breast  . Peripheral vascular disease Maternal Grandmother   . Hypertension Maternal Grandfather   . Heart failure Maternal Grandfather     History  Substance Use Topics  . Smoking status: Never Smoker   . Smokeless tobacco: Not on file  . Alcohol Use: No    Allergies:  Allergies  Allergen Reactions  . Amoxicillin Hives  . Cephalexin Hives    Prescriptions prior to admission  Medication Sig Dispense Refill Last Dose  . aspirin EC 81 MG tablet Take 81 mg by mouth daily.   11/26/2013 at Unknown time  . estradiol (ESTRACE) 2 MG tablet Take 2 mg by mouth 2 (two) times daily.   11/26/2013 at Unknown time  .  Prenatal Vit-Fe Fumarate-FA (PRENATAL MULTIVITAMIN) TABS Take 1 tablet by mouth daily.   11/26/2013 at Unknown time  . progesterone (ENDOMETRIN) 100 MG vaginal insert Place 100 mg vaginally 2 (two) times daily.   11/26/2013 at Unknown time  . progesterone (PROMETRIUM) 200 MG capsule Take 400 mg by mouth at bedtime.   11/25/2013 at Unknown time  . ferrous sulfate 325 (65 FE) MG tablet Take 1 tablet (325 mg total) by mouth 2 (two) times daily with a meal. (Patient not taking: Reported on 11/26/2013) 60 tablet 0   . oxyCODONE-acetaminophen (PERCOCET/ROXICET) 5-325 MG per tablet Take 1-2 tablets by mouth every 4 (four) hours as needed. (Patient not taking: Reported on 11/26/2013) 30 tablet 0     Review of Systems  Constitutional: Negative.   Respiratory: Negative.   Cardiovascular: Negative.   Gastrointestinal: Negative for nausea, vomiting, abdominal pain, diarrhea and constipation.  Genitourinary: Negative for dysuria, urgency, frequency, hematuria and flank pain.       + spotting, pelvic pain  Musculoskeletal: Negative.        + L shoulder pain  Neurological: Negative.   Psychiatric/Behavioral: Negative.    Physical Exam   Blood pressure 146/70, pulse 92, temperature 98.1 F (36.7 C), resp. rate  18, height 5\' 4"  (1.626 m), weight 164 lb (74.39 kg), unknown if currently breastfeeding.  Physical Exam  Nursing note and vitals reviewed. Constitutional: She is oriented to person, place, and time. She appears well-developed and well-nourished. No distress.  Cardiovascular: Normal rate.   Respiratory: Effort normal.  GI: Soft. She exhibits no distension and no mass. There is tenderness (LLQ). There is no rebound and no guarding.  Genitourinary: Uterus is enlarged (c/w dates). Uterus is not tender. Cervix exhibits no motion tenderness. Right adnexum displays no mass, no tenderness and no fullness. Left adnexum displays fullness. Left adnexum displays no mass and no tenderness. No bleeding in the  vagina. No vaginal discharge found.  Musculoskeletal: Normal range of motion.  Neurological: She is alert and oriented to person, place, and time.  Skin: Skin is warm and dry.  Psychiatric: She has a normal mood and affect.    MAU Course  Procedures Results for orders placed or performed during the hospital encounter of 11/26/13 (from the past 24 hour(s))  Urinalysis, Routine w reflex microscopic     Status: Abnormal   Collection Time: 11/26/13  9:40 AM  Result Value Ref Range   Color, Urine YELLOW YELLOW   APPearance CLEAR CLEAR   Specific Gravity, Urine 1.010 1.005 - 1.030   pH 6.5 5.0 - 8.0   Glucose, UA NEGATIVE NEGATIVE mg/dL   Hgb urine dipstick MODERATE (A) NEGATIVE   Bilirubin Urine NEGATIVE NEGATIVE   Ketones, ur NEGATIVE NEGATIVE mg/dL   Protein, ur NEGATIVE NEGATIVE mg/dL   Urobilinogen, UA 0.2 0.0 - 1.0 mg/dL   Nitrite NEGATIVE NEGATIVE   Leukocytes, UA MODERATE (A) NEGATIVE  Urine microscopic-add on     Status: Abnormal   Collection Time: 11/26/13  9:40 AM  Result Value Ref Range   Squamous Epithelial / LPF FEW (A) RARE   WBC, UA 3-6 <3 WBC/hpf   RBC / HPF 3-6 <3 RBC/hpf   Bacteria, UA RARE RARE   Urine-Other MUCOUS PRESENT   Pregnancy, urine POC     Status: Abnormal   Collection Time: 11/26/13  9:54 AM  Result Value Ref Range   Preg Test, Ur POSITIVE (A) NEGATIVE  CBC     Status: Abnormal   Collection Time: 11/26/13 10:15 AM  Result Value Ref Range   WBC 11.1 (H) 4.0 - 10.5 K/uL   RBC 3.99 3.87 - 5.11 MIL/uL   Hemoglobin 12.6 12.0 - 15.0 g/dL   HCT 97.6 (L) 73.4 - 19.3 %   MCV 89.2 78.0 - 100.0 fL   MCH 31.6 26.0 - 34.0 pg   MCHC 35.4 30.0 - 36.0 g/dL   RDW 79.0 24.0 - 97.3 %   Platelets 288 150 - 400 K/uL  hCG, quantitative, pregnancy     Status: Abnormal   Collection Time: 11/26/13 10:15 AM  Result Value Ref Range   hCG, Beta Chain, Quant, S 82447 (H) <5 mIU/mL   US Ob Comp Less 14 Wks  11/26/2013   CLINICAL DATA:  Pelvic and left shoulder pain.  Patient recently underwent in vitro fertilization with 2 embryos. She is pregnant with a single intrauterine pregnancy, with an assigned gestational age of [redacted] weeks 3 days. She had a pelvic ultrasound in her gynecologist office on 11/25/2013. These images are not available for review.  EXAM: OBSTETRIC <14 WK [place] AND TRANSVAGINAL OB US  TECHNIQUE: Both transabdominal and transvaginal ultrasound examinations were performed for complete evaluation of the gestation as well as the maternal uterus, adnexal  regions, and pelvic cul-de-sac. Transvaginal technique was performed to assess early pregnancy.  COMPARISON:  None.  FINDINGS: Intrauterine gestational sac: Single intrauterine gestational sac is visualized.  Yolk sac:  Visualized  Embryo:  Visualized  Cardiac Activity: Yes  Heart Rate:  162 bpm  CRL:   12.4  mm   7 w 4 d  Maternal uterus/adnexae: A small focal subchorionic hemorrhage is noted inferiorly, measuring 1.5 x 1.8 x 1.1. And normal right ovary measures 3.3 x 1.6 x 2.1 cm. No right adnexal mass seen.  The left ovary is visualized and measures 2.8 x 1.8 x 1.6 cm. Surrounding the left ovary is a large heterogeneous left adnexal mass with soft tissue density and fluid density that measures 7.3 x 4.4 x 4.7 cm. No definite vascular flow seen within this left adnexal mass. Additionally, there is complex free fluid in the cul-de-sac and right adnexa, small the moderate in amount.  IMPRESSION: 1. Imaging findings suggest a heterotopic pregnancy. 2. There is a single living intrauterine gestation with crown-rump length measurements concordant with assigned gestational age of [redacted] weeks 3 days. 3. Complex 7.3 x 4.4 x 4.7 cm left adnexal mass surrounding the left ovary and small a moderate amount of complex free pelvic fluid. Findings are suspicious for a left ectopic pregnancy. The heterogeneous, predominately avascular mass could in part be due to hemorrhage/clot, and a ruptured ectopic pregnancy cannot be excluded.  Critical Value/emergent results were called by telephone at the time of interpretation on 11/26/2013 at 11:10 am to Dr. Georges Mouse , who verbally acknowledged these results.   Electronically Signed   By: Britta Mccreedy M.D.   On: 11/26/2013 11:29   US Ob Transvaginal  11/26/2013   CLINICAL DATA:  Pelvic and left shoulder pain. Patient recently underwent in vitro fertilization with 2 embryos. She is pregnant with a single intrauterine pregnancy, with an assigned gestational age of [redacted] weeks 3 days. She had a pelvic ultrasound in her gynecologist office on 11/25/2013. These images are not available for review.  EXAM: OBSTETRIC <14 WK [place] AND TRANSVAGINAL OB US  TECHNIQUE: Both transabdominal and transvaginal ultrasound examinations were performed for complete evaluation of the gestation as well as the maternal uterus, adnexal regions, and pelvic cul-de-sac. Transvaginal technique was performed to assess early pregnancy.  COMPARISON:  None.  FINDINGS: Intrauterine gestational sac: Single intrauterine gestational sac is visualized.  Yolk sac:  Visualized  Embryo:  Visualized  Cardiac Activity: Yes  Heart Rate:  162 bpm  CRL:   12.4  mm   7 w 4 d  Maternal uterus/adnexae: A small focal subchorionic hemorrhage is noted inferiorly, measuring 1.5 x 1.8 x 1.1. And normal right ovary measures 3.3 x 1.6 x 2.1 cm. No right adnexal mass seen.  The left ovary is visualized and measures 2.8 x 1.8 x 1.6 cm. Surrounding the left ovary is a large heterogeneous left adnexal mass with soft tissue density and fluid density that measures 7.3 x 4.4 x 4.7 cm. No definite vascular flow seen within this left adnexal mass. Additionally, there is complex free fluid in the cul-de-sac and right adnexa, small the moderate in amount.  IMPRESSION: 1. Imaging findings suggest a heterotopic pregnancy. 2. There is a single living intrauterine gestation with crown-rump length measurements concordant with assigned gestational age of [redacted] weeks 3 days.  3. Complex 7.3 x 4.4 x 4.7 cm left adnexal mass surrounding the left ovary and small a moderate amount of complex free pelvic fluid. Findings are  suspicious for a left ectopic pregnancy. The heterogeneous, predominately avascular mass could in part be due to hemorrhage/clot, and a ruptured ectopic pregnancy cannot be excluded. Critical Value/emergent results were called by telephone at the time of interpretation on 11/26/2013 at 11:10 am to Dr. Georges MouseNATALIE Marty Uy , who verbally acknowledged these results.   Electronically Signed   By: Britta MccreedySusan  Turner M.D.   On: 11/26/2013 11:29      Assessment and Plan  28 y.o. G2P1001 at 7.3 weeks w/ viable IU and suspected L ectopic pregnancy Reported to Dr. Chestine Sporelark, who will come to MAU to eval and discuss plan of care with patient Pt comfortable and stable at this time  [facility] DanvilleFRAZIER,Joline Encalada 11/26/2013, 11:48 AM

## 2013-11-26 NOTE — Brief Op Note (Signed)
11/26/2013  4:24 PM  PATIENT:  Miranda Hensley  28 y.o. female  PRE-OPERATIVE DIAGNOSIS:  Heterotopic pregnancy, possible ectopic   POST-OPERATIVE DIAGNOSIS:  Intrauterine pregnancy at 3764w3d, left ectopic pregnancy   PROCEDURE:  Procedure(s): DIAGNOSTIC LAPAROSCOPY WITH REMOVAL OF ECTOPIC PREGNANCY (N/A)  SURGEON:  Surgeon(s) and Role:    * Marlow Baarsyanna Netty Sullivant, MD - Primary    * Adam PhenixJames G Arnold, MD - Assisting    ANESTHESIA:   general  EBL:  Total I/O In: 1800 [I.V.:1800] Out: 425 [Urine:400; Blood:25]  BLOOD ADMINISTERED:none  DRAINS: none   LOCAL MEDICATIONS USED:  MARCAINE     SPECIMEN:  Source of Specimen:  left fallopian tube and ectopic pregnancy  DISPOSITION OF SPECIMEN:  PATHOLOGY  COUNTS:  YES  TOURNIQUET:  * No tourniquets in log *  DICTATION: .Note written in EPIC  PLAN OF CARE: Discharge to home after PACU  PATIENT DISPOSITION:  PACU - hemodynamically stable.   Delay start of Pharmacological VTE agent (>24hrs) due to surgical blood loss or risk of bleeding: not applicable

## 2013-11-26 NOTE — MAU Note (Signed)
Pt presents to MAU with complaints of pain in her shoulder and lower abdomen that comes and goes. Pt is IVF and states that she had an ultrasound and possibly is an ectopic pregnancy but states there is also confirmed pregnancy in her uterus

## 2013-11-26 NOTE — Anesthesia Procedure Notes (Signed)
Procedure Name: Intubation Date/Time: 11/26/2013 1:40 PM Performed by: Marlow BaarsLARK, DYANNA Pre-anesthesia Checklist: Patient identified, Emergency Drugs available, Suction available and Patient being monitored Patient Re-evaluated:Patient Re-evaluated prior to inductionOxygen Delivery Method: Circle system utilized Preoxygenation: Pre-oxygenation with 100% oxygen Intubation Type: IV induction, Cricoid Pressure applied and Rapid sequence Laryngoscope Size: Miller and 2 Grade View: Grade I Tube type: Oral Tube size: 7.0 mm Number of attempts: 1 Airway Equipment and Method: Stylet Placement Confirmation: ETT inserted through vocal cords under direct vision,  positive ETCO2 and breath sounds checked- equal and bilateral Secured at: 22 cm Tube secured with: Tape Dental Injury: Teeth and Oropharynx as per pre-operative assessment

## 2013-11-26 NOTE — Transfer of Care (Signed)
Immediate Anesthesia Transfer of Care Note  Patient: Miranda StadeCasey Hensley  Procedure(s) Performed: Procedure(s): DIAGNOSTIC LAPAROSCOPY WITH REMOVAL OF ECTOPIC PREGNANCY (N/A)  Patient Location: PACU  Anesthesia Type:General  Level of Consciousness: awake, alert  and oriented  Airway & Oxygen Therapy: Patient Spontanous Breathing and Patient connected to nasal cannula oxygen  Post-op Assessment: Report given to PACU RN and Post -op Vital signs reviewed and stable  Post vital signs: Reviewed and stable  Complications: No apparent anesthesia complications

## 2013-11-27 NOTE — Op Note (Signed)
Operative Note  Preop Diagnosis: 1. Intrauterine pregnancy at 5178w3d   2. Left adnexal mass concerning for ectopic pregnancy   Postop Diagnosis: 1. Heterotopic pregnancy with intrauterine pregnancy at 6778w3d and rupture ectopic pregnancy in left tube  Procedure: laparoscopy left salpingectomy  Anesthesia: general anesthesia  Surgeon: Marlow Baarsyanna Jourdan Durbin, MD and Scheryl DarterJames Arnold, MD   Specimen: left fallopian tube and ectopic pregnancy  UOP: 400 CC of clear urine  EBL: less than 50 mL   Findings:  Ruptured ectopic pregnancy in left fallopian tube.  Approximately 200 mL of blood and old clot around the left adnexa and in the posterior cul de sac.  7 week gravid uterus.  Normal right tube and ovary, normal liver and spleen.  Complications: none  Procedure: The patient was taken to the operating room after the risks, benefits, alternatives, complications, treatment options, and expected outcomes were discussed with the patient. The patient verbalized understanding, the patient concurred with the proposed plan and consent signed and witnessed.  A Time Out was held and the above information confirmed.  The patient was taken to the operating room after appropriate identification and placed on the operating table. General anesthesia was obtained without difficulty.  She was placed in the lithotomy position using Allen stirrups. Both upper extremities were padded and placed by her side. An examination under anesthesia was performed.  The abdomen was prepped with ChloraPrep. The perineum and vagina were prepped with multiple layers of Betadine.  The bladder was catheterized. The abdomen and perineum were draped as a sterile field. A sponge stick was placed in the vagina posterior to the cervix.  Neither the cervix nor the uterus were instrumented during the procedure.  A 12 mm midline infra-umbilical incision was made after infiltration with 0.25% Marcaine.  The 12-mm optiview trocar was used to enter the  peritoneal cavity under direct visualization.  A pneumoperitoneum was created with CO2 gas.  The 10 mm operative laparoscope was introduced and a complete abdominal and pelvic survey was performed with findings noted above. Patient was placed in trendelenburg and marcaine injected in the RLQ and a 5 mm incision was made and 5 mm trocar advanced into the intraabdominal cavity.  The same was done in the LLQ area. This was performed under direct video visualization. There was no noted injury with placement of any trochars.   Hemoperitoneum was identified upon entry.  Suction irrigation was used to clear clot around the area of the left adnexa.  At this time, the left fallopian tube was identified with clear evidence of rupture of the ectopic pregnancy near the isthmus of the tube. There was significant clot around the left adnexa, and the sigmoid colon was noted to be adhered to the tube.  Blunt dissection was used break the adhesions of the sigmoid to the left adnexa.  At this time, the fimbriated end of the tube was identified.  The LigaSure device was advanced into the abdomen.  The LigaSure device was used to cauterize and cut fallopian tube at the level of the uterine cornua.  Sequential bites were used to separate the tube from the surrounding mesosalpinx along the length of the tube.  The freed tube was placed into the anterior cul de sac.  There was significant clot that remained around the left ovary.  Suction irrigation was used to clear some of the remaining clot.  The left ovary was identified and appeared normal.  An EndoCatch bag was introduced into the abdomen.  The left fallopian tube  and ectopic pregnancy were placed into the bag and removed from the abdomen without difficulty. The pelvis was surveyed once again and hemostasis noted.  10 mL of SurgiFlo was placed around the left adnexa.    All trochars were then removed from the peritoneal cavity under direct visualization as the CO2 was allowed  to escape. The supra-umbilical incision was closed with 0- Vicryl in a figure-of-eight fashion. All skin incisions were closed with subcuticular sutures of 3-0 Monocryl then covered with Dermabond.  The Foley catheter was. The patient was awakened from general anesthesia and taken to the recovery room in satisfactory condition having tolerated the procedure well with sponge and instrument counts correct. It was anticipated that she would be discharged home later that afternoon.  Upon arrival in PACU, a bedside ultrasound confirmed an intrauterine pregnancy with a fetal heart rate in the 160s.

## 2013-11-28 ENCOUNTER — Encounter (HOSPITAL_COMMUNITY): Payer: Self-pay | Admitting: Obstetrics

## 2013-11-30 ENCOUNTER — Other Ambulatory Visit: Payer: Self-pay | Admitting: Obstetrics and Gynecology

## 2013-11-30 LAB — OB RESULTS CONSOLE GC/CHLAMYDIA
Chlamydia: NEGATIVE
Gonorrhea: NEGATIVE

## 2013-12-01 LAB — CYTOLOGY - PAP

## 2013-12-21 LAB — OB RESULTS CONSOLE HEPATITIS B SURFACE ANTIGEN: HEP B S AG: NEGATIVE

## 2013-12-21 LAB — OB RESULTS CONSOLE RUBELLA ANTIBODY, IGM: RUBELLA: IMMUNE

## 2013-12-21 LAB — OB RESULTS CONSOLE RPR: RPR: NONREACTIVE

## 2013-12-21 LAB — OB RESULTS CONSOLE HIV ANTIBODY (ROUTINE TESTING): HIV: NONREACTIVE

## 2014-01-13 HISTORY — PX: CHOLECYSTECTOMY, LAPAROSCOPIC: SHX56

## 2014-01-13 NOTE — L&D Delivery Note (Signed)
Patient was C/C/+3 and pushed for 5 minutes with epidural.   NSVD  female infant, Apgars 8,9, weight P.   The patient had second degree perineal laceration repaired with  2-0 vicryl R. Fundus was firm. EBL was expected amount. Placenta was delivered intact. Vagina was clear.  Baby was vigorous and doing skin to skin with mother.  Ibrahem Volkman A

## 2014-01-23 ENCOUNTER — Other Ambulatory Visit (HOSPITAL_COMMUNITY): Payer: Self-pay

## 2014-01-23 ENCOUNTER — Encounter (HOSPITAL_COMMUNITY): Payer: Self-pay | Admitting: Obstetrics and Gynecology

## 2014-01-23 ENCOUNTER — Other Ambulatory Visit (HOSPITAL_COMMUNITY): Payer: Self-pay | Admitting: Obstetrics and Gynecology

## 2014-01-23 DIAGNOSIS — O352XX1 Maternal care for (suspected) hereditary disease in fetus, fetus 1: Secondary | ICD-10-CM

## 2014-01-24 ENCOUNTER — Encounter (HOSPITAL_COMMUNITY): Payer: Self-pay

## 2014-01-24 ENCOUNTER — Ambulatory Visit (HOSPITAL_COMMUNITY)
Admission: RE | Admit: 2014-01-24 | Discharge: 2014-01-24 | Disposition: A | Discharge status: Home / Self Care | Payer: BLUE CROSS/BLUE SHIELD | Source: Ambulatory Visit | Attending: Obstetrics and Gynecology | Admitting: Obstetrics and Gynecology

## 2014-01-24 ENCOUNTER — Other Ambulatory Visit (HOSPITAL_COMMUNITY): Payer: Self-pay

## 2014-01-24 ENCOUNTER — Other Ambulatory Visit (HOSPITAL_COMMUNITY): Payer: Self-pay | Admitting: Obstetrics and Gynecology

## 2014-01-24 DIAGNOSIS — Z315 Encounter for genetic counseling: Secondary | ICD-10-CM | POA: Diagnosis not present

## 2014-01-24 DIAGNOSIS — O352XX0 Maternal care for (suspected) hereditary disease in fetus, not applicable or unspecified: Secondary | ICD-10-CM | POA: Insufficient documentation

## 2014-01-24 DIAGNOSIS — Z3A15 15 weeks gestation of pregnancy: Secondary | ICD-10-CM | POA: Diagnosis not present

## 2014-01-24 DIAGNOSIS — O289 Unspecified abnormal findings on antenatal screening of mother: Secondary | ICD-10-CM | POA: Insufficient documentation

## 2014-01-24 DIAGNOSIS — O280 Abnormal hematological finding on antenatal screening of mother: Secondary | ICD-10-CM

## 2014-01-24 DIAGNOSIS — R772 Abnormality of alphafetoprotein: Secondary | ICD-10-CM | POA: Insufficient documentation

## 2014-01-24 DIAGNOSIS — O09812 Supervision of pregnancy resulting from assisted reproductive technology, second trimester: Secondary | ICD-10-CM | POA: Diagnosis not present

## 2014-01-24 DIAGNOSIS — Z3689 Encounter for other specified antenatal screening: Secondary | ICD-10-CM | POA: Insufficient documentation

## 2014-01-24 DIAGNOSIS — O352XX1 Maternal care for (suspected) hereditary disease in fetus, fetus 1: Secondary | ICD-10-CM

## 2014-01-24 NOTE — Progress Notes (Signed)
Genetic Counseling  High-Risk Gestation Note  Appointment Date:  01/24/2014 Referred By: Levi AlandAnderson, Mark E, MD Date of Birth:  [birth year] Partner:  Miranda Hensley, Miranda Derek   Pregnancy History: [id number] Estimated Date of Delivery: 07/12/14 Estimated Gestational Age: 3531w6d Attending: Damaris Hippoenney, Jeffrey, MD   Mrs. Miranda Hensley was seen for consultation for genetic counseling because of an elevated MSAFP of 2.61 MoMs based on maternal serum AFP screening through [facility].    We reviewed Mrs. Laviolette's maternal serum screening result, the elevation of MSAFP, and the associated 1 in 236 risk for a fetal open neural tube defect.  We reviewed ONTDs, the typical multifactorial etiology, and variable prognosis.  In addition, we discussed additional explanations for an elevated MSAFP including: normal variation, twins, feto-maternal bleeding, a gestational dating error, abdominal wall defects, kidney differences, oligohydramnios, and placental problems.  We discussed that an unexplained elevation of MSAFP is associated with an increased risk for third trimester complications including: prematurity, low birth weight, and pre-eclampsia.    We reviewed additional available screening and diagnostic options including detailed ultrasound and amniocentesis.  We discussed the risks, limitations, and benefits of each.  After thoughtful consideration of these options, Mrs.  elected to have ultrasound, but declined amniocentesis.  She understands that ultrasound cannot rule out all birth defects or genetic syndromes.  However, she was counseled that 80-90% of fetuses with ONTDs can be detected by detailed 2nd trimester ultrasound, when well visualized.  A complete ultrasound was performed today.  The ultrasound report will be documented separately.  Given the early gestational age (2931w6d), Mrs. Miranda Hensley was scheduled to return in 2 weeks to further evaluate the fetal anatomy.  Mrs. Miranda Hensley was provided with  written information regarding cystic fibrosis (CF) including the carrier frequency and incidence in the Caucasian population, the availability of carrier testing and prenatal diagnosis if indicated.  In addition, we discussed that CF is routinely screened for as part of the [place] newborn screening panel.  She declined testing today, but would like to discuss this option in further detail with her referring provider at her next OB visit.  Both family histories were reviewed and found to be noncontributory for birth defects, intellectual disability, and known genetic conditions.  Without further information regarding the provided family history, an accurate genetic risk cannot be calculated. Further genetic counseling is warranted if more information is obtained.  Mrs. Miranda Hensley denied exposure to environmental toxins or chemical agents. She denied the use of alcohol, tobacco or street drugs. She denied significant viral illnesses during the course of her pregnancy.   I counseled Miranda Hensley for approximately 35 minutes regarding the above risks and available options.    Donald Prosehristy S. Daxtyn Rottenberg, MS Certified Genetic Counselor

## 2014-01-26 ENCOUNTER — Ambulatory Visit (HOSPITAL_COMMUNITY): Payer: Self-pay

## 2014-01-27 ENCOUNTER — Inpatient Hospital Stay (HOSPITAL_COMMUNITY): Admission: RE | Admit: 2014-01-27 | Payer: Self-pay | Source: Ambulatory Visit

## 2014-02-02 ENCOUNTER — Inpatient Hospital Stay (HOSPITAL_COMMUNITY)
Admission: AD | Admit: 2014-02-02 | Discharge: 2014-02-02 | Disposition: A | Discharge status: Home / Self Care | Payer: BLUE CROSS/BLUE SHIELD | Source: Ambulatory Visit | Attending: Obstetrics | Admitting: Obstetrics

## 2014-02-02 ENCOUNTER — Inpatient Hospital Stay (HOSPITAL_COMMUNITY): Payer: BLUE CROSS/BLUE SHIELD

## 2014-02-02 ENCOUNTER — Encounter (HOSPITAL_COMMUNITY): Payer: Self-pay | Admitting: *Deleted

## 2014-02-02 DIAGNOSIS — Z3A17 17 weeks gestation of pregnancy: Secondary | ICD-10-CM | POA: Insufficient documentation

## 2014-02-02 DIAGNOSIS — O4412 Placenta previa with hemorrhage, second trimester: Secondary | ICD-10-CM | POA: Diagnosis not present

## 2014-02-02 DIAGNOSIS — O4410 Placenta previa with hemorrhage, unspecified trimester: Secondary | ICD-10-CM

## 2014-02-02 DIAGNOSIS — O209 Hemorrhage in early pregnancy, unspecified: Secondary | ICD-10-CM

## 2014-02-02 DIAGNOSIS — O4420 Partial placenta previa NOS or without hemorrhage, unspecified trimester: Secondary | ICD-10-CM | POA: Insufficient documentation

## 2014-02-02 LAB — URINALYSIS, ROUTINE W REFLEX MICROSCOPIC
BILIRUBIN URINE: NEGATIVE
Glucose, UA: NEGATIVE mg/dL
Ketones, ur: NEGATIVE mg/dL
LEUKOCYTES UA: NEGATIVE
Nitrite: NEGATIVE
Protein, ur: NEGATIVE mg/dL
Specific Gravity, Urine: 1.02 (ref 1.005–1.030)
UROBILINOGEN UA: 0.2 mg/dL (ref 0.0–1.0)
pH: 6 (ref 5.0–8.0)

## 2014-02-02 LAB — URINE MICROSCOPIC-ADD ON

## 2014-02-02 NOTE — MAU Provider Note (Signed)
History     CSN: [id number]  Arrival date and time: 02/02/14 4132   First Provider Initiated Contact with Patient 02/02/14 [phone]      Chief Complaint  Patient presents with  . Vaginal Bleeding   HPI  Ms. Miranda Hensley is a 29 y.o. G2P1001 at [redacted]w[redacted]d who presents to MAU today with complaint of vaginal bleeding since 0520 today. She states a history of surgical salpingectomy 11/26/13 for a heterotopic pregnancy at 7 weeks with current pregnancy. She also states abnormal AFP results in the office which prompted [place] with MFM on 01/24/14. US showed a right lateral marginal previa. The patient denies previous episodes of bleeding during this pregnancy. She states that she had a moderate gush of bright red blood this morning and has continued to have spotting since then. She states upon arrival in MAU she noted only brown spotting with wiping. She denies abdominal pain, vaginal discharge or recent intercourse.   OB History    Gravida Para Term Preterm AB TAB SAB Ectopic Multiple Living   Past Medical History  Diagnosis Date  . No pertinent past medical history   . Newborn product of IVF pregnancy   . Hx of varicella   . Varicose veins   . Medical history non-contributory     Past Surgical History  Procedure Laterality Date  . No past surgeries    . Ivf    . Diagnostic laparoscopy with removal of ectopic pregnancy N/A 11/26/2013    Procedure: DIAGNOSTIC LAPAROSCOPY WITH REMOVAL OF ECTOPIC PREGNANCY;  Surgeon: Marlow Baars, MD;  Location: [facility];  Service: Gynecology;  Laterality: N/A;    Family History  Problem Relation Age of Onset  . Heart disease Mother   . Hypertension Mother   . Anemia Mother   . Diabetes Mother   . Heart disease Brother   . Hypertension Brother   . Thyroid disease Maternal Grandmother   . Cancer Maternal Grandmother     breast  . Peripheral vascular disease Maternal Grandmother   . Hypertension Maternal Grandfather   . Heart failure  Maternal Grandfather     History  Substance Use Topics  . Smoking status: Never Smoker   . Smokeless tobacco: Not on file  . Alcohol Use: No    Allergies:  Allergies  Allergen Reactions  . Cephalexin Hives    Prescriptions prior to admission  Medication Sig Dispense Refill Last Dose  . estradiol (ESTRACE) 2 MG tablet Take 2 mg by mouth 2 (two) times daily.   11/26/2013 at Unknown time  . ferrous sulfate 325 (65 FE) MG tablet Take 1 tablet (325 mg total) by mouth 2 (two) times daily with a meal. (Patient not taking: Reported on 11/26/2013) 60 tablet 0   . oxyCODONE (OXY IR/ROXICODONE) 5 MG immediate release tablet Take 1 tablet (5 mg total) by mouth every 6 (six) hours as needed for severe pain. 20 tablet 0   . Prenatal Vit-Fe Fumarate-FA (PRENATAL MULTIVITAMIN) TABS Take 1 tablet by mouth daily.   11/26/2013 at Unknown time  . progesterone (ENDOMETRIN) 100 MG vaginal insert Place 100 mg vaginally 2 (two) times daily.   11/26/2013 at Unknown time  . progesterone (PROMETRIUM) 200 MG capsule Take 400 mg by mouth at bedtime.   11/26/2013 at Unknown time  . promethazine (PHENERGAN) 12.5 MG tablet Take 1-2 tablets every 6 hours as needed for nausea 20 tablet 0  Review of Systems  Constitutional: Negative for fever and malaise/fatigue.  Gastrointestinal: Negative for nausea, vomiting, abdominal pain, diarrhea and constipation.  Genitourinary: Negative for dysuria, urgency and frequency.       + vaginal bleeding Neg - vaginal discharge   Physical Exam   Blood pressure 123/63, pulse 101, temperature 99.2 F (37.3 C), temperature source Oral, resp. rate 18, height 5\' 5"  (1.651 m), weight 167 lb 6 oz (75.921 kg), SpO2 100 %, unknown if currently breastfeeding.  Physical Exam  Constitutional: She is oriented to person, place, and time. She appears well-developed and well-nourished. No distress.  HENT:  Head: Normocephalic.  Cardiovascular: Normal rate.   Respiratory: Effort normal.   GI: Soft. She exhibits no distension and no mass. There is no tenderness. There is no rebound and no guarding.  Genitourinary: Cervix exhibits no discharge (no active bleeding from the cervical os) and no friability. There is bleeding (small amount of blood noted in the vagina) in the vagina.  Neurological: She is alert and oriented to person, place, and time.  Skin: Skin is warm and dry. No erythema.  Psychiatric: She has a normal mood and affect.   Results for orders placed or performed during the hospital encounter of 02/02/14 (from the past 24 hour(s))  Urinalysis, Routine w reflex microscopic     Status: Abnormal   Collection Time: 02/02/14  6:45 AM  Result Value Ref Range   Color, Urine YELLOW YELLOW   APPearance CLEAR CLEAR   Specific Gravity, Urine 1.020 1.005 - 1.030   pH 6.0 5.0 - 8.0   Glucose, UA NEGATIVE NEGATIVE mg/dL   Hgb urine dipstick LARGE (A) NEGATIVE   Bilirubin Urine NEGATIVE NEGATIVE   Ketones, ur NEGATIVE NEGATIVE mg/dL   Protein, ur NEGATIVE NEGATIVE mg/dL   Urobilinogen, UA 0.2 0.0 - 1.0 mg/dL   Nitrite NEGATIVE NEGATIVE   Leukocytes, UA NEGATIVE NEGATIVE  Urine microscopic-add on     Status: None   Collection Time: 02/02/14  6:45 AM  Result Value Ref Range   Squamous Epithelial / LPF RARE RARE   RBC / HPF 21-50 <3 RBC/hpf   Blood type A+  MAU Course  Procedures None  MDM FHR - 158 bpm with doppler Attempted to contact Dr. Mora ApplPinn at [phone] and 0750. LM for her to return call to MAU to discuss patient [id number] - Discussed with Pinn. US OB Limited to evaluate placenta previa 0800 - Patient waiting for US. Care turned over to [facility] Sid[facility] Nutter, CNM  Marny LowensteinJulie N Wenzel, PA-C  02/02/2014, 7:59 AM   8 AM: Dorathy KinsmanVirginia Duane Earnshaw, CNM assumed care of patient. Patient awaiting ultrasound.      Assessment and Plan     ICD-9-CM ICD-10-CM   1. Vaginal bleeding before [redacted] weeks gestation 640.90 O20.9 Urinalysis, Routine w reflex microscopic     Urine microscopic-add on      US OB Limited     US OB Limited  2. Marginal placenta previa 641.10 O44.10 Urinalysis, Routine w reflex microscopic     Urine microscopic-add on     US OB Limited     US OB Limited   Discharge home in stable condition per consult with Dr. Mora ApplPinn. Bleeding precautions and pelvic rest. Follow-up Information    Follow up with PINN, Sanjuana MaeWALDA STACIA, MD. Schedule an appointment as soon as possible for a visit in 1 week.   Specialty:  Obstetrics and Gynecology   Contact information:   [address] [place] [id number]  [phone]       Follow up with [facility] [place] MATERNITY ADMISSIONS.   Why:  As needed in emergencies   Contact information:   [address] [id number] mc [place] [address] [phone]     Dorathy Kinsman, [place] 02/02/2014 4:52 PM

## 2014-02-02 NOTE — MAU Note (Signed)
Pt presented with bleeding that started at 0520 when in bed. Pt stated that about "a cup of blood" came out. Pt wore a pad in but just a brownish discharge remains on pad. Pt having some cramping for the last 2 weeks intermittent, in lower abdomen.

## 2014-02-02 NOTE — MAU Note (Signed)
Pt states that she is having some "tightness" in the chest and feels like there is some tingling in her right arm.

## 2014-02-02 NOTE — Discharge Instructions (Signed)
Placenta Previa  Placenta previa is a condition in pregnant women where the placenta implants in the lower part of the uterus. The placenta either partially or completely covers the opening to the cervix. This is a problem because the baby must pass through the cervix during delivery. There are three types of placenta previa. They include:   Marginal placenta previa. The placenta is near the cervix, but does not cover the opening.  Partial placenta previa. The placenta covers part of the cervical opening.  Complete placenta previa. The placenta covers the entire cervical opening.  Depending on the type of placenta previa, there is a chance the placenta may move into a normal position and no longer cover the cervix as the pregnancy progresses. It is important to keep all prenatal visits with your caregiver.  RISK FACTORS You may be more likely to develop placenta previa if you:   Are carrying more than one baby (multiples).   Have an abnormally shaped uterus.   Have scars on the lining of the uterus.   Had previous surgeries involving the uterus, such as a cesarean delivery.   Have delivered a baby previously.   Have a history of placenta previa.   Have smoked or used cocaine during pregnancy.   Are age 31 or older during pregnancy.  SYMPTOMS The main symptom of placenta previa is sudden, painless vaginal bleeding during the second half of pregnancy. The amount of bleeding can be light to very heavy. The bleeding may stop on its own, but almost always returns. Cramping, regular contractions, abdominal pain, and lower back pain can also occur with placenta previa.  DIAGNOSIS Placenta previa can be diagnosed through an ultrasound by finding where the placenta is located. The ultrasound may find placenta previa either during a routine prenatal visit or after vaginal bleeding is noticed. If you are diagnosed with placenta previa, your caregiver may avoid vaginal exams to reduce the  risk of heavy bleeding. There is a chance that placenta previa may not be diagnosed until bleeding occurs during labor.  TREATMENT Specific treatment depends on:   How much you are bleeding or if the bleeding has stopped.  How far along you are in your pregnancy.   The condition of the baby.   The location of the baby and placenta.   The type of placenta previa.  Depending on the factors above, your caregiver may recommend:   Decreased activity.   Bed rest at home or in the hospital.  Pelvic rest. This means no sex, using tampons, douching, pelvic exams, or placing anything into the vagina.  A blood transfusion to replace maternal blood loss.  A cesarean delivery if the bleeding is heavy and cannot be controlled or the placenta completely covers the cervix.  Medication to stop premature labor or mature the fetal lungs if delivery is needed before the pregnancy is full term.  WHEN SHOULD YOU SEEK IMMEDIATE MEDICAL CARE IF YOU ARE SENT HOME WITH PLACENTA PREVIA? Seek immediate medical care if you show any symptoms of placenta previa. You will need to go to the hospital to get checked immediately. Again, those symptoms are:  Sudden, painless vaginal bleeding, even a small amount.  Cramping or regular contractions.  Lower back or abdominal pain. Document Released: 12/30/2004 Document Revised: 09/01/2012 Document Reviewed: 04/02/2012 [facility] Patient Information 2015 [place], [place]. This information is not intended to replace advice given to you by your health care provider. Make sure you discuss any questions you have with your health  care provider.   Pelvic Rest Pelvic rest is sometimes recommended for women when:   The placenta is partially or completely covering the opening of the cervix (placenta previa).  There is bleeding between the uterine wall and the amniotic sac in the first trimester (subchorionic hemorrhage).  The cervix begins to open without labor  starting (incompetent cervix, cervical insufficiency).  The labor is too early (preterm labor). HOME CARE INSTRUCTIONS  Do not have sexual intercourse, stimulation, or an orgasm.  Do not use tampons, douche, or put anything in the vagina.  Do not lift anything over 10 pounds (4.5 kg).  Avoid strenuous activity or straining your pelvic muscles. SEEK MEDICAL CARE IF:  You have any vaginal bleeding during pregnancy. Treat this as a potential emergency.  You have cramping pain felt low in the stomach (stronger than menstrual cramps).  You notice vaginal discharge (watery, mucus, or bloody).  You have a low, dull backache.  There are regular contractions or uterine tightening. SEEK IMMEDIATE MEDICAL CARE IF: You have vaginal bleeding and have placenta previa.  Document Released: 04/26/2010 Document Revised: 03/24/2011 Document Reviewed: 04/26/2010 [facility] Patient Information 2015 [place]. This information is not intended to replace advice given to you by your health care provider. Make sure you discuss any questions you have with your health care provider.

## 2014-02-07 ENCOUNTER — Ambulatory Visit (HOSPITAL_COMMUNITY): Payer: Self-pay

## 2014-02-07 ENCOUNTER — Ambulatory Visit (HOSPITAL_COMMUNITY)
Admission: RE | Admit: 2014-02-07 | Discharge: 2014-02-07 | Disposition: A | Discharge status: Home / Self Care | Payer: BLUE CROSS/BLUE SHIELD | Source: Ambulatory Visit | Attending: Obstetrics and Gynecology | Admitting: Obstetrics and Gynecology

## 2014-02-07 ENCOUNTER — Encounter (HOSPITAL_COMMUNITY): Payer: Self-pay

## 2014-02-07 ENCOUNTER — Other Ambulatory Visit (HOSPITAL_COMMUNITY): Payer: Self-pay | Admitting: Obstetrics and Gynecology

## 2014-02-07 ENCOUNTER — Other Ambulatory Visit (HOSPITAL_COMMUNITY): Payer: Self-pay | Admitting: Maternal and Fetal Medicine

## 2014-02-07 DIAGNOSIS — Z3A17 17 weeks gestation of pregnancy: Secondary | ICD-10-CM | POA: Insufficient documentation

## 2014-02-07 DIAGNOSIS — O09819 Supervision of pregnancy resulting from assisted reproductive technology, unspecified trimester: Secondary | ICD-10-CM | POA: Insufficient documentation

## 2014-02-07 DIAGNOSIS — O09812 Supervision of pregnancy resulting from assisted reproductive technology, second trimester: Secondary | ICD-10-CM

## 2014-02-07 DIAGNOSIS — Z36 Encounter for antenatal screening of mother: Secondary | ICD-10-CM | POA: Diagnosis not present

## 2014-02-07 DIAGNOSIS — Z0489 Encounter for examination and observation for other specified reasons: Secondary | ICD-10-CM | POA: Insufficient documentation

## 2014-02-07 DIAGNOSIS — O280 Abnormal hematological finding on antenatal screening of mother: Secondary | ICD-10-CM | POA: Insufficient documentation

## 2014-02-07 DIAGNOSIS — O4412 Placenta previa with hemorrhage, second trimester: Secondary | ICD-10-CM | POA: Diagnosis not present

## 2014-02-07 DIAGNOSIS — IMO0002 Reserved for concepts with insufficient information to code with codable children: Secondary | ICD-10-CM | POA: Insufficient documentation

## 2014-02-07 DIAGNOSIS — O4402 Placenta previa specified as without hemorrhage, second trimester: Secondary | ICD-10-CM | POA: Insufficient documentation

## 2014-04-04 ENCOUNTER — Encounter (HOSPITAL_COMMUNITY): Payer: Self-pay

## 2014-04-04 ENCOUNTER — Ambulatory Visit (HOSPITAL_COMMUNITY)
Admission: RE | Admit: 2014-04-04 | Discharge: 2014-04-04 | Disposition: A | Discharge status: Home / Self Care | Payer: BLUE CROSS/BLUE SHIELD | Source: Ambulatory Visit | Attending: Obstetrics and Gynecology | Admitting: Obstetrics and Gynecology

## 2014-04-04 DIAGNOSIS — O280 Abnormal hematological finding on antenatal screening of mother: Secondary | ICD-10-CM

## 2014-04-04 DIAGNOSIS — Z3A25 25 weeks gestation of pregnancy: Secondary | ICD-10-CM | POA: Insufficient documentation

## 2014-04-04 DIAGNOSIS — O283 Abnormal ultrasonic finding on antenatal screening of mother: Secondary | ICD-10-CM | POA: Insufficient documentation

## 2014-04-04 DIAGNOSIS — O09812 Supervision of pregnancy resulting from assisted reproductive technology, second trimester: Secondary | ICD-10-CM

## 2014-04-04 DIAGNOSIS — O4412 Placenta previa with hemorrhage, second trimester: Secondary | ICD-10-CM | POA: Insufficient documentation

## 2014-04-10 ENCOUNTER — Other Ambulatory Visit (HOSPITAL_COMMUNITY): Payer: Self-pay | Admitting: Maternal and Fetal Medicine

## 2014-04-10 DIAGNOSIS — O09813 Supervision of pregnancy resulting from assisted reproductive technology, third trimester: Secondary | ICD-10-CM

## 2014-04-10 DIAGNOSIS — O280 Abnormal hematological finding on antenatal screening of mother: Secondary | ICD-10-CM

## 2014-04-10 DIAGNOSIS — O4403 Placenta previa specified as without hemorrhage, third trimester: Secondary | ICD-10-CM

## 2014-05-02 ENCOUNTER — Encounter (HOSPITAL_COMMUNITY): Payer: Self-pay

## 2014-05-02 ENCOUNTER — Ambulatory Visit (HOSPITAL_COMMUNITY)
Admission: RE | Admit: 2014-05-02 | Discharge: 2014-05-02 | Disposition: A | Discharge status: Home / Self Care | Payer: BLUE CROSS/BLUE SHIELD | Source: Ambulatory Visit | Attending: Obstetrics and Gynecology | Admitting: Obstetrics and Gynecology

## 2014-05-02 DIAGNOSIS — O280 Abnormal hematological finding on antenatal screening of mother: Secondary | ICD-10-CM

## 2014-05-02 DIAGNOSIS — O4403 Placenta previa specified as without hemorrhage, third trimester: Secondary | ICD-10-CM | POA: Diagnosis not present

## 2014-05-02 DIAGNOSIS — Z3A29 29 weeks gestation of pregnancy: Secondary | ICD-10-CM | POA: Diagnosis not present

## 2014-05-02 DIAGNOSIS — O09813 Supervision of pregnancy resulting from assisted reproductive technology, third trimester: Secondary | ICD-10-CM | POA: Diagnosis not present

## 2014-06-07 ENCOUNTER — Other Ambulatory Visit: Payer: Self-pay | Admitting: Obstetrics & Gynecology

## 2014-06-07 LAB — OB RESULTS CONSOLE GBS: GBS: NEGATIVE

## 2014-06-29 ENCOUNTER — Inpatient Hospital Stay (HOSPITAL_COMMUNITY)
Admission: AD | Admit: 2014-06-29 | Discharge: 2014-07-01 | DRG: 775 | Disposition: A | Discharge status: Home / Self Care | Payer: BLUE CROSS/BLUE SHIELD | Source: Ambulatory Visit | Attending: Obstetrics and Gynecology | Admitting: Obstetrics and Gynecology

## 2014-06-29 DIAGNOSIS — IMO0001 Reserved for inherently not codable concepts without codable children: Secondary | ICD-10-CM

## 2014-06-29 DIAGNOSIS — O09813 Supervision of pregnancy resulting from assisted reproductive technology, third trimester: Secondary | ICD-10-CM

## 2014-06-29 DIAGNOSIS — Z3A38 38 weeks gestation of pregnancy: Secondary | ICD-10-CM | POA: Diagnosis present

## 2014-06-30 ENCOUNTER — Inpatient Hospital Stay (HOSPITAL_COMMUNITY): Payer: BLUE CROSS/BLUE SHIELD | Admitting: Anesthesiology

## 2014-06-30 ENCOUNTER — Inpatient Hospital Stay (HOSPITAL_COMMUNITY)
Admission: AD | Admit: 2014-06-30 | Payer: BLUE CROSS/BLUE SHIELD | Source: Ambulatory Visit | Admitting: Obstetrics and Gynecology

## 2014-06-30 ENCOUNTER — Encounter (HOSPITAL_COMMUNITY): Payer: Self-pay | Admitting: *Deleted

## 2014-06-30 DIAGNOSIS — O09813 Supervision of pregnancy resulting from assisted reproductive technology, third trimester: Secondary | ICD-10-CM | POA: Diagnosis not present

## 2014-06-30 DIAGNOSIS — IMO0001 Reserved for inherently not codable concepts without codable children: Secondary | ICD-10-CM

## 2014-06-30 DIAGNOSIS — Z3A38 38 weeks gestation of pregnancy: Secondary | ICD-10-CM | POA: Diagnosis present

## 2014-06-30 LAB — RPR: RPR Ser Ql: NONREACTIVE

## 2014-06-30 LAB — CBC
HCT: 34.7 % — ABNORMAL LOW (ref 36.0–46.0)
Hemoglobin: 12.4 g/dL (ref 12.0–15.0)
MCH: 33.3 pg (ref 26.0–34.0)
MCHC: 35.7 g/dL (ref 30.0–36.0)
MCV: 93.3 fL (ref 78.0–100.0)
PLATELETS: 218 10*3/uL (ref 150–400)
RBC: 3.72 MIL/uL — ABNORMAL LOW (ref 3.87–5.11)
RDW: 13.3 % (ref 11.5–15.5)
WBC: 15.6 10*3/uL — AB (ref 4.0–10.5)

## 2014-06-30 LAB — TYPE AND SCREEN
ABO/RH(D): A POS
ANTIBODY SCREEN: NEGATIVE

## 2014-06-30 LAB — AMNISURE RUPTURE OF MEMBRANE (ROM) NOT AT ARMC: Amnisure ROM: NEGATIVE

## 2014-06-30 MED ORDER — EPHEDRINE 5 MG/ML INJ: 10.0000 mg | INTRAVENOUS | Status: DC | PRN

## 2014-06-30 MED ORDER — ONDANSETRON HCL 4 MG PO TABS: 4.0000 mg | ORAL_TABLET | ORAL | Status: DC | PRN

## 2014-06-30 MED ORDER — LIDOCAINE HCL (PF) 1 % IJ SOLN
INTRAMUSCULAR | Status: DC | PRN
  Administered 2014-06-30: 6 mL
  Administered 2014-06-30: 4 mL

## 2014-06-30 MED ORDER — OXYCODONE-ACETAMINOPHEN 5-325 MG PO TABS: 2.0000 | ORAL_TABLET | ORAL | Status: DC | PRN

## 2014-06-30 MED ORDER — MAGNESIUM HYDROXIDE 400 MG/5ML PO SUSP: 30.0000 mL | ORAL | Status: DC | PRN

## 2014-06-30 MED ORDER — LACTATED RINGERS IV SOLN
INTRAVENOUS | Status: DC
  Administered 2014-06-30: 02:00:00 via INTRAVENOUS

## 2014-06-30 MED ORDER — BENZOCAINE-MENTHOL 20-0.5 % EX AERO
1.0000 "application " | INHALATION_SPRAY | CUTANEOUS | Status: DC | PRN
  Administered 2014-07-01: 1 via TOPICAL
  Filled 2014-06-30 (×2): qty 56

## 2014-06-30 MED ORDER — DIPHENHYDRAMINE HCL 25 MG PO CAPS: 25.0000 mg | ORAL_CAPSULE | Freq: Four times a day (QID) | ORAL | Status: DC | PRN

## 2014-06-30 MED ORDER — OXYCODONE-ACETAMINOPHEN 5-325 MG PO TABS: 1.0000 | ORAL_TABLET | ORAL | Status: DC | PRN

## 2014-06-30 MED ORDER — METHYLERGONOVINE MALEATE 0.2 MG PO TABS: 0.2000 mg | ORAL_TABLET | ORAL | Status: DC | PRN

## 2014-06-30 MED ORDER — SODIUM CHLORIDE 0.9 % IJ SOLN: 3.0000 mL | INTRAMUSCULAR | Status: DC | PRN

## 2014-06-30 MED ORDER — FLEET ENEMA 7-19 GM/118ML RE ENEM: 1.0000 | ENEMA | RECTAL | Status: DC | PRN

## 2014-06-30 MED ORDER — DIPHENHYDRAMINE HCL 50 MG/ML IJ SOLN: 12.5000 mg | INTRAMUSCULAR | Status: DC | PRN

## 2014-06-30 MED ORDER — ACETAMINOPHEN 325 MG PO TABS: 650.0000 mg | ORAL_TABLET | ORAL | Status: DC | PRN

## 2014-06-30 MED ORDER — PHENYLEPHRINE 40 MCG/ML (10ML) SYRINGE FOR IV PUSH (FOR BLOOD PRESSURE SUPPORT)
80.0000 ug | PREFILLED_SYRINGE | INTRAVENOUS | Status: DC | PRN
  Filled 2014-06-30: qty 20

## 2014-06-30 MED ORDER — DIBUCAINE 1 % RE OINT
1.0000 "application " | TOPICAL_OINTMENT | RECTAL | Status: DC | PRN
  Filled 2014-06-30: qty 28

## 2014-06-30 MED ORDER — LIDOCAINE HCL (PF) 1 % IJ SOLN
30.0000 mL | INTRAMUSCULAR | Status: DC | PRN
  Filled 2014-06-30: qty 30

## 2014-06-30 MED ORDER — SENNOSIDES-DOCUSATE SODIUM 8.6-50 MG PO TABS
2.0000 | ORAL_TABLET | ORAL | Status: DC
  Administered 2014-06-30: 2 via ORAL
  Filled 2014-06-30: qty 2

## 2014-06-30 MED ORDER — METHYLERGONOVINE MALEATE 0.2 MG/ML IJ SOLN: 0.2000 mg | INTRAMUSCULAR | Status: DC | PRN

## 2014-06-30 MED ORDER — WITCH HAZEL-GLYCERIN EX PADS: 1.0000 "application " | MEDICATED_PAD | CUTANEOUS | Status: DC | PRN

## 2014-06-30 MED ORDER — ONDANSETRON HCL 4 MG/2ML IJ SOLN: 4.0000 mg | Freq: Four times a day (QID) | INTRAMUSCULAR | Status: DC | PRN

## 2014-06-30 MED ORDER — TETANUS-DIPHTH-ACELL PERTUSSIS 5-2.5-18.5 LF-MCG/0.5 IM SUSP: 0.5000 mL | Freq: Once | INTRAMUSCULAR | Status: DC

## 2014-06-30 MED ORDER — ONDANSETRON HCL 4 MG/2ML IJ SOLN: 4.0000 mg | INTRAMUSCULAR | Status: DC | PRN

## 2014-06-30 MED ORDER — FENTANYL 2.5 MCG/ML BUPIVACAINE 1/10 % EPIDURAL INFUSION (WH - ANES): 14.0000 mL/h | INTRAMUSCULAR | Status: DC | PRN

## 2014-06-30 MED ORDER — SODIUM BICARBONATE 8.4 % IV SOLN
INTRAVENOUS | Status: DC | PRN
  Administered 2014-06-30: 3 mL via EPIDURAL

## 2014-06-30 MED ORDER — LANOLIN HYDROUS EX OINT: TOPICAL_OINTMENT | CUTANEOUS | Status: DC | PRN

## 2014-06-30 MED ORDER — CITRIC ACID-SODIUM CITRATE 334-500 MG/5ML PO SOLN: 30.0000 mL | ORAL | Status: DC | PRN

## 2014-06-30 MED ORDER — SODIUM CHLORIDE 0.9 % IV SOLN: 250.0000 mL | INTRAVENOUS | Status: DC | PRN

## 2014-06-30 MED ORDER — SODIUM CHLORIDE 0.9 % IJ SOLN: 3.0000 mL | Freq: Two times a day (BID) | INTRAMUSCULAR | Status: DC

## 2014-06-30 MED ORDER — PRENATAL MULTIVITAMIN CH
1.0000 | ORAL_TABLET | Freq: Every day | ORAL | Status: DC
  Administered 2014-06-30 – 2014-07-01 (×2): 1 via ORAL
  Filled 2014-06-30 (×2): qty 1

## 2014-06-30 MED ORDER — MEASLES, MUMPS & RUBELLA VAC ~~LOC~~ INJ: 0.5000 mL | INJECTION | Freq: Once | SUBCUTANEOUS | Status: DC

## 2014-06-30 MED ORDER — ZOLPIDEM TARTRATE 5 MG PO TABS: 5.0000 mg | ORAL_TABLET | Freq: Every evening | ORAL | Status: DC | PRN

## 2014-06-30 MED ORDER — FERROUS SULFATE 325 (65 FE) MG PO TABS
325.0000 mg | ORAL_TABLET | Freq: Two times a day (BID) | ORAL | Status: DC
  Administered 2014-06-30 – 2014-07-01 (×2): 325 mg via ORAL
  Filled 2014-06-30 (×2): qty 1

## 2014-06-30 MED ORDER — OXYTOCIN BOLUS FROM INFUSION
500.0000 mL | INTRAVENOUS | Status: DC
  Administered 2014-06-30: 500 mL via INTRAVENOUS

## 2014-06-30 MED ORDER — OXYTOCIN 40 UNITS IN LACTATED RINGERS INFUSION - SIMPLE MED
62.5000 mL/h | INTRAVENOUS | Status: DC
  Filled 2014-06-30: qty 1000

## 2014-06-30 MED ORDER — FENTANYL 2.5 MCG/ML BUPIVACAINE 1/10 % EPIDURAL INFUSION (WH - ANES)
14.0000 mL/h | INTRAMUSCULAR | Status: DC | PRN
  Administered 2014-06-30: 14 mL/h via EPIDURAL
  Filled 2014-06-30: qty 125

## 2014-06-30 MED ORDER — LACTATED RINGERS IV SOLN: 500.0000 mL | INTRAVENOUS | Status: DC | PRN

## 2014-06-30 MED ORDER — SIMETHICONE 80 MG PO CHEW: 80.0000 mg | CHEWABLE_TABLET | ORAL | Status: DC | PRN

## 2014-06-30 MED ORDER — IBUPROFEN 800 MG PO TABS
800.0000 mg | ORAL_TABLET | Freq: Three times a day (TID) | ORAL | Status: DC
  Administered 2014-06-30 – 2014-07-01 (×5): 800 mg via ORAL
  Filled 2014-06-30 (×5): qty 1

## 2014-06-30 NOTE — Lactation Note (Deleted)
This note was copied from the chart of Miranda Alimah Garone. Lactation Consultation Note  When I entered the room it was observed that mom was very sleepy.  Her daughter twin B was being held by a visitor and rooting.  She was informed that the baby was hungry and needed to be breast fed.  Mom stated she was expecting visitors.  I asked her how she wanted the baby to be fed and she decided that she would BF her.  Mom was assisted in latching the baby.  Baby latched deeply but only suckled when stimulated.  Baby came off of the breast a few times but had a fair feeding.  Mom is very tired and does not seem motivated to feed the babies on cue.  She also does not seem to be absorbing teaching at this point.  After discussion with RN and pediatrician it was decided to routinely supplement both babies with formula.  There is a finger feeder in the room but if mom is too tired to feed them an artificial nipple will be initiated. RN is aware of feeding plan.  Patient Name: Miranda Hensley NIOEV'O Date: 06/30/2014 Reason for consult: Initial assessment   Maternal Data Has patient been taught Hand Expression?: Yes Does the patient have breastfeeding experience prior to this delivery?: Yes  Feeding Feeding Type: Breast Fed  LATCH Score/Interventions Latch: Repeated attempts needed to sustain latch, nipple held in mouth throughout feeding, stimulation needed to elicit sucking reflex.  Audible Swallowing: Spontaneous and intermittent  Type of Nipple: Everted at rest and after stimulation  Comfort (Breast/Nipple): Filling, red/small blisters or bruises, mild/mod discomfort     Hold (Positioning): Assistance needed to correctly position infant at breast and maintain latch.  LATCH Score: 7  Lactation Tools Discussed/Used     Consult Status Consult Status: Follow-up Date: 07/01/14 Follow-up type: In-patient    Soyla Dryer 06/30/2014, 5:18 PM

## 2014-06-30 NOTE — MAU Note (Signed)
Leaking fluid since noon. Denies vaginal bleeding or contractions. Positive fetal movement.

## 2014-06-30 NOTE — Anesthesia Postprocedure Evaluation (Signed)
Anesthesia Post Note  Patient: Miranda Hensley  Procedure(s) Performed: * No procedures listed *  Anesthesia type: Epidural  Patient location: Mother/Baby  Post pain: Pain level controlled  Post assessment: Post-op Vital signs reviewed  Last Vitals:  Filed Vitals:   06/30/14 1225  BP: 121/71  Pulse: 101  Temp: 36.9 C  Resp: 18    Post vital signs: Reviewed  Level of consciousness:alert  Complications: No apparent anesthesia complications

## 2014-06-30 NOTE — MAU Note (Signed)
Pt reports leaking on and off throughout the day since 12:00. Pt reports some intermittent back pain.

## 2014-06-30 NOTE — Anesthesia Preprocedure Evaluation (Signed)

## 2014-06-30 NOTE — Lactation Note (Deleted)
This note was copied from the chart of Miranda Hensley. Lactation Consultation Note  Patient Name: Miranda Hensley CLEXN'T Date: 06/30/2014 Reason for consult: Initial assessment   Maternal Data Has patient been taught Hand Expression?: Yes Does the patient have breastfeeding experience prior to this delivery?: Yes  Feeding Feeding Type: Breast Fed  LATCH Score/Interventions Latch: Repeated attempts needed to sustain latch, nipple held in mouth throughout feeding, stimulation needed to elicit sucking reflex.  Audible Swallowing: Spontaneous and intermittent  Type of Nipple: Everted at rest and after stimulation  Comfort (Breast/Nipple): Filling, red/small blisters or bruises, mild/mod discomfort     Hold (Positioning): Assistance needed to correctly position infant at breast and maintain latch.  LATCH Score: 7  Lactation Tools Discussed/Used     Consult Status Consult Status: Follow-up Date: 07/01/14 Follow-up type: In-patient    Soyla Dryer 06/30/2014, 2:57 PM

## 2014-06-30 NOTE — Lactation Note (Signed)
This note was copied from the chart of Miranda Hensley. Lactation Consultation Note  Experienced BF mother reports that she stopped BF her  1st child at 6 mos related to oversupply and baby having acid reflux. I assisted her with latching this baby.  She reported increased comfort with deeper latch.  I explained to her that if she noticed her baby was eating for long periods of time or what seemed to be all the time as her first baby did lactation support should be sought out.  Lactation handouts were given and she is aware of support groups and outpatient services. Patient Name: Miranda Hensley BULAG'T Date: 06/30/2014 Reason for consult: Initial assessment   Maternal Data Has patient been taught Hand Expression?: Yes Does the patient have breastfeeding experience prior to this delivery?: Yes  Feeding Feeding Type: Breast Fed  LATCH Score/Interventions Latch: Repeated attempts needed to sustain latch, nipple held in mouth throughout feeding, stimulation needed to elicit sucking reflex.  Audible Swallowing: Spontaneous and intermittent  Type of Nipple: Everted at rest and after stimulation  Comfort (Breast/Nipple): Filling, red/small blisters or bruises, mild/mod discomfort     Hold (Positioning): Assistance needed to correctly position infant at breast and maintain latch.  LATCH Score: 7  Lactation Tools Discussed/Used     Consult Status Consult Status: Follow-up Date: 07/01/14 Follow-up type: In-patient    Soyla Dryer 06/30/2014, 3:03 PM

## 2014-06-30 NOTE — Anesthesia Procedure Notes (Signed)

## 2014-06-30 NOTE — H&P (Addendum)
29 y.o. [redacted]w[redacted]d  G2P1001 comes in c/o labor and LOF- LOF definitely happened on  exam in MAU.  Otherwise has good fetal movement and no bleeding.  Past Medical History  Diagnosis Date  . No pertinent past medical history   . Newborn product of IVF pregnancy   . Hx of varicella   . Varicose veins   . Medical history non-contributory     Past Surgical History  Procedure Laterality Date  . No past surgeries    . Ivf    . Diagnostic laparoscopy with removal of ectopic pregnancy N/A 11/26/2013    Procedure: DIAGNOSTIC LAPAROSCOPY WITH REMOVAL OF ECTOPIC PREGNANCY;  Surgeon: Marlow Baars, MD;  Location: [facility];  Service: Gynecology;  Laterality: N/A;    OB History  Gravida Para Term Preterm AB SAB TAB Ectopic Multiple Living  2 1 1       1     # Outcome Date GA Lbr Len/2nd Weight Sex Delivery Anes PTL Lv  2 Current           1 Term 02/24/12 [redacted]w[redacted]d 13:31 / 01:57 3.5 kg (7 lb 11.5 oz) F Vag-Spont EPI  Y     Comments: none      History   Social History  . Marital Status: Married    Spouse Name: N/A  . Number of Children: N/A  . Years of Education: N/A   Occupational History  . Not on file.   Social History Main Topics  . Smoking status: Never Smoker   . Smokeless tobacco: Not on file  . Alcohol Use: No  . Drug Use: No  . Sexual Activity: No   Other Topics Concern  . Not on file   Social History Narrative   Cephalexin    Prenatal Transfer Tool  Maternal Diabetes: No Genetic Screening: Normal- NIPT low risk Maternal Ultrasounds/Referrals: Normal Fetal Ultrasounds or other Referrals:  Referred to Materal Fetal Medicine  Maternal Substance Abuse:  No Significant Maternal Medications:  None Significant Maternal Lab Results: Lab values include: Other: abnormal AFP- normal [place] per MFM  Other PNC: uncomplicated.  IVF pt.    Filed Vitals:   06/30/14 0036  BP: 132/80  Pulse: 99  Temp: 97.9 F (36.6 C)  Resp: 18     Lungs/Cor:  NAD Abdomen:  soft, gravid Ex:  no  cords, erythema SVE:  9/C/+1, clear. FHTs:  130, good STV, NST R Toco:  q 3-4   A/P   Term labor.  GBS Neg.  Miranda Hensley A

## 2014-07-01 LAB — CBC
HEMATOCRIT: 26.3 % — AB (ref 36.0–46.0)
HEMOGLOBIN: 9.1 g/dL — AB (ref 12.0–15.0)
MCH: 33.1 pg (ref 26.0–34.0)
MCHC: 34.6 g/dL (ref 30.0–36.0)
MCV: 95.6 fL (ref 78.0–100.0)
Platelets: 178 10*3/uL (ref 150–400)
RBC: 2.75 MIL/uL — ABNORMAL LOW (ref 3.87–5.11)
RDW: 13.7 % (ref 11.5–15.5)
WBC: 13.5 10*3/uL — ABNORMAL HIGH (ref 4.0–10.5)

## 2014-07-01 MED ORDER — OXYCODONE-ACETAMINOPHEN 5-325 MG PO TABS: 2.0000 | ORAL_TABLET | ORAL | Status: AC | PRN

## 2014-07-01 NOTE — Discharge Summary (Signed)
Obstetric Discharge Summary Reason for Admission: rupture of membranes Prenatal Procedures: ultrasound Intrapartum Procedures: spontaneous vaginal delivery Postpartum Procedures: none Complications-Operative and Postpartum: 2nd degree perineal laceration HEMOGLOBIN  Date Value Ref Range Status  07/01/2014 9.1* 12.0 - 15.0 g/dL Final    Comment:    DELTA CHECK NOTED REPEATED TO VERIFY    HCT  Date Value Ref Range Status  07/01/2014 26.3* 36.0 - 46.0 % Final    Physical Exam:  General: alert Lochia: appropriate Uterine Fundus: firm   Discharge Diagnoses: Term Pregnancy-delivered  Discharge Information: Date: 07/01/2014 Activity: pelvic rest Diet: routine Medications: PNV, Ibuprofen and Percocet Condition: stable Instructions: refer to practice specific booklet Discharge to: home Follow-up Information    Follow up with HORVATH,MICHELLE A, MD. Schedule an appointment as soon as possible for a visit in 1 month.   Specialty:  Obstetrics and Gynecology   Contact information:   [address] [place] [address] [phone]       Newborn Data: Live born female  Birth Weight: 6 lb 5.9 oz (2890 g) APGAR: 8, 9  Home with mother.  ANDERSON,MARK E 07/01/2014, 7:38 AM

## 2014-07-01 NOTE — Progress Notes (Signed)
PPD#1 Pt doing well. She would like to go home.  VSSAF IMP/doing well. Plan/ Will discharge

## 2014-07-01 NOTE — Lactation Note (Signed)
This note was copied from the chart of Miranda Hensley. Lactation Consultation Note Mom states nipples are becoming sore.  She has comfort gels.  Mom is currently breastfeeding baby using cradle hold.  She states she usually latches baby using cross cradle.  Baby is latched well and nursing actively.  When baby came off breast nipple round with no pinching.  Nipples intact and slightly red.   Mom had sore nipples for 6 months with first baby and is concerned this may occur again with newborn.  Discussed proper techniques to obtain wide latch.  I also encouraged mom to use good breast massage during feeding so milk flow is better.  Assisted with positioning baby in football hold on right breast.  Baby has a small mouth so I showed FOB how he can assist with breast compression.  Baby latched easily with good depth.  Bottom lip needed to be untucked a few times.  Reviewed discharge teaching and answered questions.  Outpatient lactation services reviewed and encouraged.  Patient Name: Miranda Hensley [id number] Date: 07/01/2014 Reason for consult: Follow-up assessment;Breast/nipple pain   Maternal Data    Feeding Feeding Type: Breast Fed  LATCH Score/Interventions Latch: Grasps breast easily, tongue down, lips flanged, rhythmical sucking. Intervention(s): Adjust position;Assist with latch;Breast massage;Breast compression  Audible Swallowing: A few with stimulation Intervention(s): Alternate breast massage  Type of Nipple: Everted at rest and after stimulation  Comfort (Breast/Nipple): Filling, red/small blisters or bruises, mild/mod discomfort  Problem noted: Mild/Moderate discomfort Interventions (Mild/moderate discomfort): Comfort gels  Hold (Positioning): Assistance needed to correctly position infant at breast and maintain latch. Intervention(s): Breastfeeding basics reviewed;Support Pillows;Position options;Skin to skin  LATCH Score: 7  Lactation Tools Discussed/Used Tools:  Comfort gels   Consult Status Consult Status: Complete    Huston Foley 07/01/2014, 9:40 AM

## 2014-07-02 ENCOUNTER — Ambulatory Visit: Payer: Self-pay

## 2014-07-02 NOTE — Lactation Note (Signed)
This note was copied from the chart of Miranda Hensley. Lactation Consultation Note  I was notified per telephone call today form [facility] [employer] that note was made to this patients chart in error on 06/30/14 by Soyla Dryer, RN, IBCLC.  The note was documented on wrong chart.  I was asked to delete the note in error which I did.  Patient Name: Miranda Yavonne Bona [id number] Date: 07/02/2014     Maternal Data    Feeding    LATCH Score/Interventions                      Lactation Tools Discussed/Used     Consult Status      Huston Foley 07/02/2014, 2:05 PM

## 2014-08-02 ENCOUNTER — Encounter (HOSPITAL_COMMUNITY): Payer: Self-pay | Admitting: Emergency Medicine

## 2014-08-02 ENCOUNTER — Emergency Department (HOSPITAL_COMMUNITY): Payer: BLUE CROSS/BLUE SHIELD

## 2014-08-02 ENCOUNTER — Emergency Department (HOSPITAL_COMMUNITY)
Admission: EM | Admit: 2014-08-02 | Discharge: 2014-08-02 | Disposition: A | Discharge status: Home / Self Care | Payer: BLUE CROSS/BLUE SHIELD | Attending: Emergency Medicine | Admitting: Emergency Medicine

## 2014-08-02 DIAGNOSIS — K2970 Gastritis, unspecified, without bleeding: Secondary | ICD-10-CM | POA: Insufficient documentation

## 2014-08-02 DIAGNOSIS — K8020 Calculus of gallbladder without cholecystitis without obstruction: Secondary | ICD-10-CM | POA: Diagnosis not present

## 2014-08-02 DIAGNOSIS — M549 Dorsalgia, unspecified: Secondary | ICD-10-CM

## 2014-08-02 DIAGNOSIS — Z3202 Encounter for pregnancy test, result negative: Secondary | ICD-10-CM | POA: Insufficient documentation

## 2014-08-02 DIAGNOSIS — Z79899 Other long term (current) drug therapy: Secondary | ICD-10-CM | POA: Diagnosis not present

## 2014-08-02 DIAGNOSIS — R1013 Epigastric pain: Secondary | ICD-10-CM | POA: Diagnosis present

## 2014-08-02 DIAGNOSIS — K8050 Calculus of bile duct without cholangitis or cholecystitis without obstruction: Secondary | ICD-10-CM

## 2014-08-02 DIAGNOSIS — R109 Unspecified abdominal pain: Secondary | ICD-10-CM

## 2014-08-02 LAB — CBC
HEMATOCRIT: 39.3 % (ref 36.0–46.0)
Hemoglobin: 13.2 g/dL (ref 12.0–15.0)
MCH: 31.1 pg (ref 26.0–34.0)
MCHC: 33.6 g/dL (ref 30.0–36.0)
MCV: 92.7 fL (ref 78.0–100.0)
PLATELETS: 212 10*3/uL (ref 150–400)
RBC: 4.24 MIL/uL (ref 3.87–5.11)
RDW: 12.8 % (ref 11.5–15.5)
WBC: 9.1 10*3/uL (ref 4.0–10.5)

## 2014-08-02 LAB — I-STAT BETA HCG BLOOD, ED (MC, WL, AP ONLY): I-stat hCG, quantitative: <5 m[IU]/mL (ref <5)

## 2014-08-02 LAB — COMPREHENSIVE METABOLIC PANEL
ALT: 72 U/L — ABNORMAL HIGH (ref 14–54)
ANION GAP: 9 (ref 5–15)
AST: 68 U/L — ABNORMAL HIGH (ref 15–41)
Albumin: 3.8 g/dL (ref 3.5–5.0)
Alkaline Phosphatase: 121 U/L (ref 38–126)
BUN: 18 mg/dL (ref 6–20)
CALCIUM: 9.2 mg/dL (ref 8.9–10.3)
CHLORIDE: 104 mmol/L (ref 101–111)
CO2: 26 mmol/L (ref 22–32)
Creatinine, Ser: 0.82 mg/dL (ref 0.44–1.00)
Glucose, Bld: 116 mg/dL — ABNORMAL HIGH (ref 65–99)
Potassium: 3.5 mmol/L (ref 3.5–5.1)
SODIUM: 139 mmol/L (ref 135–145)
Total Bilirubin: 0.6 mg/dL (ref 0.3–1.2)
Total Protein: 6.8 g/dL (ref 6.5–8.1)

## 2014-08-02 LAB — URINALYSIS, ROUTINE W REFLEX MICROSCOPIC
Bilirubin Urine: NEGATIVE
Glucose, UA: NEGATIVE mg/dL
KETONES UR: NEGATIVE mg/dL
NITRITE: NEGATIVE
Protein, ur: NEGATIVE mg/dL
Specific Gravity, Urine: 1.022 (ref 1.005–1.030)
UROBILINOGEN UA: 0.2 mg/dL (ref 0.0–1.0)
pH: 5 (ref 5.0–8.0)

## 2014-08-02 LAB — URINE MICROSCOPIC-ADD ON

## 2014-08-02 LAB — LIPASE, BLOOD: LIPASE: 52 U/L — AB (ref 22–51)

## 2014-08-02 MED ORDER — OMEPRAZOLE 20 MG PO CPDR: 20.0000 mg | DELAYED_RELEASE_CAPSULE | Freq: Two times a day (BID) | ORAL | Status: AC

## 2014-08-02 MED ORDER — IOHEXOL 300 MG/ML  SOLN
100.0000 mL | Freq: Once | INTRAMUSCULAR | Status: AC | PRN
  Administered 2014-08-02: 100 mL via INTRAVENOUS

## 2014-08-02 MED ORDER — HYDROCODONE-ACETAMINOPHEN 5-325 MG PO TABS: 2.0000 | ORAL_TABLET | ORAL | Status: AC | PRN

## 2014-08-02 MED ORDER — ONDANSETRON 4 MG PO TBDP: 4.0000 mg | ORAL_TABLET | Freq: Three times a day (TID) | ORAL | Status: AC | PRN

## 2014-08-02 MED ORDER — IOHEXOL 300 MG/ML  SOLN: 100.0000 mL | Freq: Once | INTRAMUSCULAR | Status: DC | PRN

## 2014-08-02 NOTE — Discharge Instructions (Signed)
Abdominal Pain Many things can cause abdominal pain. Usually, abdominal pain is not caused by a disease and will improve without treatment. It can often be observed and treated at home. Your health care provider will do a physical exam and possibly order blood tests and X-rays to help determine the seriousness of your pain. However, in many cases, more time must pass before a clear cause of the pain can be found. Before that point, your health care provider may not know if you need more testing or further treatment. HOME CARE INSTRUCTIONS  Monitor your abdominal pain for any changes. The following actions may help to alleviate any discomfort you are experiencing:  Only take over-the-counter or prescription medicines as directed by your health care provider.  Do not take laxatives unless directed to do so by your health care provider.  Try a clear liquid diet (broth, tea, or water) as directed by your health care provider. Slowly move to a bland diet as tolerated. SEEK MEDICAL CARE IF:  You have unexplained abdominal pain.  You have abdominal pain associated with nausea or diarrhea.  You have pain when you urinate or have a bowel movement.  You experience abdominal pain that wakes you in the night.  You have abdominal pain that is worsened or improved by eating food.  You have abdominal pain that is worsened with eating fatty foods.  You have a fever. SEEK IMMEDIATE MEDICAL CARE IF:   Your pain does not go away within 2 hours.  You keep throwing up (vomiting).  Your pain is felt only in portions of the abdomen, such as the right side or the left lower portion of the abdomen.  You pass bloody or black tarry stools. MAKE SURE YOU:  Understand these instructions.   Will watch your condition.   Will get help right away if you are not doing well or get worse.  Document Released: 10/09/2004 Document Revised: 01/04/2013 Document Reviewed: 09/08/2012 [facility] Patient Information  2015 [place], [place]. This information is not intended to replace advice given to you by your health care provider. Make sure you discuss any questions you have with your health care provider.  Cholelithiasis Cholelithiasis (also called gallstones) is a form of gallbladder disease. The gallbladder is a small organ that helps you digest fats. Symptoms of gallstones are:  Feeling sick to your stomach (nausea).  Throwing up (vomiting).  Belly pain.  Yellowing of the skin (jaundice).  Sudden pain. You may feel the pain for minutes to hours.  Fever.  Pain to the touch. HOME CARE  Only take medicines as told by your doctor.  Eat a low-fat diet until you see your doctor again. Eating fat can result in pain.  Follow up with your doctor as told. Attacks usually happen time after time. Surgery is usually needed for permanent treatment. GET HELP RIGHT AWAY IF:   Your pain gets worse.  Your pain is not helped by medicines.  You have a fever and lasting symptoms for more than 2-3 days.  You have a fever and your symptoms suddenly get worse.  You keep feeling sick to your stomach and throwing up. MAKE SURE YOU:   Understand these instructions.  Will watch your condition.  Will get help right away if you are not doing well or get worse. Document Released: 06/18/2007 Document Revised: 09/01/2012 Document Reviewed: 06/23/2012 [facility] Patient Information 2015 [place], [place]. This information is not intended to replace advice given to you by your health care provider. Make sure  you discuss any questions you have with your health care provider.  Gastritis, Adult Gastritis is soreness and swelling (inflammation) of the lining of the stomach. Gastritis can develop as a sudden onset (acute) or long-term (chronic) condition. If gastritis is not treated, it can lead to stomach bleeding and ulcers. CAUSES  Gastritis occurs when the stomach lining is weak or damaged. Digestive juices from the  stomach then inflame the weakened stomach lining. The stomach lining may be weak or damaged due to viral or bacterial infections. One common bacterial infection is the Helicobacter pylori infection. Gastritis can also result from excessive alcohol consumption, taking certain medicines, or having too much acid in the stomach.  SYMPTOMS  In some cases, there are no symptoms. When symptoms are present, they may include:  Pain or a burning sensation in the upper abdomen.  Nausea.  Vomiting.  An uncomfortable feeling of fullness after eating. DIAGNOSIS  Your caregiver may suspect you have gastritis based on your symptoms and a physical exam. To determine the cause of your gastritis, your caregiver may perform the following:  Blood or stool tests to check for the H pylori bacterium.  Gastroscopy. A thin, flexible tube (endoscope) is passed down the esophagus and into the stomach. The endoscope has a light and camera on the end. Your caregiver uses the endoscope to view the inside of the stomach.  Taking a tissue sample (biopsy) from the stomach to examine under a microscope. TREATMENT  Depending on the cause of your gastritis, medicines may be prescribed. If you have a bacterial infection, such as an H pylori infection, antibiotics may be given. If your gastritis is caused by too much acid in the stomach, H2 blockers or antacids may be given. Your caregiver may recommend that you stop taking aspirin, ibuprofen, or other nonsteroidal anti-inflammatory drugs (NSAIDs). HOME CARE INSTRUCTIONS  Only take over-the-counter or prescription medicines as directed by your caregiver.  If you were given antibiotic medicines, take them as directed. Finish them even if you start to feel better.  Drink enough fluids to keep your urine clear or pale yellow.  Avoid foods and drinks that make your symptoms worse, such as:  Caffeine or alcoholic drinks.  Chocolate.  Peppermint or mint flavorings.  Garlic  and onions.  Spicy foods.  Citrus fruits, such as oranges, lemons, or limes.  Tomato-based foods such as sauce, chili, salsa, and pizza.  Fried and fatty foods.  Eat small, frequent meals instead of large meals. SEEK IMMEDIATE MEDICAL CARE IF:   You have black or dark red stools.  You vomit blood or material that looks like coffee grounds.  You are unable to keep fluids down.  Your abdominal pain gets worse.  You have a fever.  You do not feel better after 1 week.  You have any other questions or concerns. MAKE SURE YOU:  Understand these instructions.  Will watch your condition.  Will get help right away if you are not doing well or get worse. Document Released: 12/24/2000 Document Revised: 07/01/2011 Document Reviewed: 02/12/2011 [facility] Patient Information 2015 [place]. This information is not intended to replace advice given to you by your health care provider. Make sure you discuss any questions you have with your health care provider.

## 2014-08-02 NOTE — ED Notes (Signed)
Pt. woke up this morning with mid back pain and upper abdominal pain , emesis , denies diarrhea / no hematuria or dysuria . No fever or chills.

## 2014-08-02 NOTE — ED Notes (Signed)
PT monitored by pulse ox, bp cuff, and 5-lead. Urine sample at bedside.

## 2014-08-02 NOTE — ED Provider Notes (Signed)
CSN: [id number]     Arrival date & time 08/02/14  0503 History   First MD Initiated Contact with Patient 08/02/14 [phone]     Chief Complaint  Patient presents with  . Back Pain  . Abdominal Pain  . Emesis     (Consider location/radiation/quality/duration/timing/severity/associated sxs/prior Treatment) Patient is a 29 y.o. female presenting with abdominal pain.  Abdominal Pain Pain location:  Epigastric Pain quality: sharp   Pain radiates to:  Back Pain severity:  Moderate Onset quality:  Gradual Duration:  1 day Timing:  Constant Progression:  Unchanged Chronicity:  New Context comment:  Gave birth 1 month ago, no symptoms since that time Relieved by:  Nothing Worsened by:  Movement and palpation Ineffective treatments:  None tried Associated symptoms: anorexia, chills, nausea and vomiting   Associated symptoms: no fever     Past Medical History  Diagnosis Date  . No pertinent past medical history   . Newborn product of IVF pregnancy   . Hx of varicella   . Varicose veins   . Medical history non-contributory    Past Surgical History  Procedure Laterality Date  . No past surgeries    . Ivf    . Diagnostic laparoscopy with removal of ectopic pregnancy N/A 11/26/2013    Procedure: DIAGNOSTIC LAPAROSCOPY WITH REMOVAL OF ECTOPIC PREGNANCY;  Surgeon: Marlow Baars, MD;  Location: [facility];  Service: Gynecology;  Laterality: N/A;   Family History  Problem Relation Age of Onset  . Heart disease Mother   . Hypertension Mother   . Anemia Mother   . Diabetes Mother   . Heart disease Brother   . Hypertension Brother   . Thyroid disease Maternal Grandmother   . Cancer Maternal Grandmother     breast  . Peripheral vascular disease Maternal Grandmother   . Hypertension Maternal Grandfather   . Heart failure Maternal Grandfather    History  Substance Use Topics  . Smoking status: Never Smoker   . Smokeless tobacco: Not on file  . Alcohol Use: No   OB History    Gravida  Para Term Preterm AB TAB SAB Ectopic Multiple Living   0 2     Review of Systems  Constitutional: Positive for chills. Negative for fever.  Gastrointestinal: Positive for nausea, vomiting, abdominal pain and anorexia.  All other systems reviewed and are negative.     Allergies  Cephalexin  Home Medications   Prior to Admission medications   Medication Sig Start Date End Date Taking? Authorizing Provider  calcium carbonate (TUMS - DOSED IN MG ELEMENTAL CALCIUM) 500 MG chewable tablet Chew 2 tablets by mouth as needed for indigestion or heartburn.   Yes Historical Provider, MD  Prenatal Vit-Fe Fumarate-FA (PRENATAL MULTIVITAMIN) TABS Take 1 tablet by mouth daily.   Yes Historical Provider, MD  HYDROcodone-acetaminophen (NORCO/VICODIN) 5-325 MG per tablet Take 2 tablets by mouth every 4 (four) hours as needed. 08/02/14   Rolland Porter, MD  omeprazole (PRILOSEC) 20 MG capsule Take 1 capsule (20 mg total) by mouth 2 (two) times daily. 08/02/14   Rolland Porter, MD  ondansetron (ZOFRAN ODT) 4 MG disintegrating tablet Take 1 tablet (4 mg total) by mouth every 8 (eight) hours as needed for nausea. 08/02/14   Rolland Porter, MD  oxyCODONE-acetaminophen (PERCOCET/ROXICET) 5-325 MG per tablet Take 2 tablets by mouth every 4 (four) hours as needed (for pain scale greater than 7). Patient not taking: Reported on 08/02/2014 07/01/14  Levi AlandMark E Anderson, MD   BP 104/49 mmHg  Pulse 75  Temp(Src) 98.2 F (36.8 C) (Oral)  Resp 16  Ht 5\' 4"  (1.626 m)  Wt 160 lb (72.576 kg)  BMI 27.45 kg/m2  SpO2 100% Physical Exam  Constitutional: She is oriented to person, place, and time. She appears well-developed and well-nourished.  HENT:  Head: Normocephalic and atraumatic.  Right Ear: External ear normal.  Left Ear: External ear normal.  Eyes: Conjunctivae and EOM are normal. Pupils are equal, round, and reactive to light.  Neck: Normal range of motion. Neck supple.  Cardiovascular: Normal rate, regular  rhythm, normal heart sounds and intact distal pulses.   Pulmonary/Chest: Effort normal and breath sounds normal.  Abdominal: Soft. Bowel sounds are normal. There is tenderness in the epigastric area.  Musculoskeletal: Normal range of motion.  Neurological: She is alert and oriented to person, place, and time.  Skin: Skin is warm and dry.  Vitals reviewed.   ED Course  Procedures (including critical care time) Labs Review Labs Reviewed  LIPASE, BLOOD - Abnormal; Notable for the following:    Lipase 52 (*)    All other components within normal limits  COMPREHENSIVE METABOLIC PANEL - Abnormal; Notable for the following:    Glucose, Bld 116 (*)    AST 68 (*)    ALT 72 (*)    All other components within normal limits  URINALYSIS, ROUTINE W REFLEX MICROSCOPIC (NOT [facility]) - Abnormal; Notable for the following:    Hgb urine dipstick MODERATE (*)    Leukocytes, UA TRACE (*)    All other components within normal limits  URINE MICROSCOPIC-ADD ON - Abnormal; Notable for the following:    Squamous Epithelial / LPF FEW (*)    Bacteria, UA FEW (*)    All other components within normal limits  CBC  I-STAT BETA HCG BLOOD, ED ([facility], [facility], [facility] ONLY)  I-STAT BETA HCG BLOOD, ED ([facility], [facility], [facility] ONLY)    Imaging Review Ct Abdomen Pelvis W Contrast  08/02/2014   CLINICAL DATA:  Mid back pain and upper abdominal pain, emesis.  EXAM: CT ABDOMEN AND PELVIS WITH CONTRAST  TECHNIQUE: Multidetector CT imaging of the abdomen and pelvis was performed using the standard protocol following bolus administration of intravenous contrast.  CONTRAST:  100mL OMNIPAQUE IOHEXOL 300 MG/ML  SOLN  COMPARISON:  None.  FINDINGS: Gallbladder is mildly distended and contains at least 2 small stones. There is questionable pericholecystic fluid and periportal edema. No significant extrahepatic bile duct dilatation identified, however, there is a focus of subtle increased density within the distal portion of the common bile duct that may  represent a small stone or sludge (image 29).  No focal mass or lesion identified within the liver parenchyma. Spleen, pancreas, and adrenal glands are normal. Kidneys appear normal without stone or hydronephrosis.  Bowel is normal in caliber. No bowel wall thickening or evidence of bowel wall inflammation. Moderate amount of stool within the nondistended colon. Appendix is normal. Trace free fluid in the lower pelvis is likely physiologic in nature. Adnexal regions are unremarkable. No enlarged lymph nodes seen. No free intraperitoneal air.  Abdominal aorta is normal in caliber. Lung bases are clear. No osseous abnormality.  IMPRESSION: Gallbladder is mildly distended containing at least 2 small stones. No definite gallbladder wall thickening but questionable pericholecystic fluid/ edema. If any localizable right upper quadrant pain, would consider right upper quadrant ultrasound to exclude cholecystitis.  No common bile duct dilatation but subtle  density within the distal common bile duct is suspicious for nonobstructing bile duct stone or sludge.  Remainder of the abdomen and pelvis CT is unremarkable, as detailed above.   Electronically Signed   By: Bary Richard M.D.   On: 08/02/2014 08:25   US Abdomen Limited Ruq  08/02/2014   CLINICAL DATA:  Mid back pain and upper abdominal pain, emesis since this morning. Gallstones seen on CT.  EXAM: US ABDOMEN LIMITED - RIGHT UPPER QUADRANT  COMPARISON:  CT abdomen and pelvis from earlier same day.  FINDINGS: Gallbladder:  There are multiple small layering stones within the gallbladder. Gallbladder is mildly distended but there is no gallbladder wall thickening, pericholecystic fluid or other secondary signs of acute cholecystitis.  Common bile duct:  Diameter: Normal diameter with a demonstrated measurement of 4 mm. No bile duct stone identified to the level of the pancreatic head.  Liver:  No focal lesion identified. Within normal limits in parenchymal echogenicity.  No intrahepatic bile duct dilatation seen. Main portal vein is shown to be patent with appropriate hepatopetal direction of blood flow.  No free fluid identified within the adjacent right upper quadrant.  IMPRESSION: Cholelithiasis without evidence of acute cholecystitis.  No intrahepatic or extrahepatic bile duct dilatation. No bile duct stone identified.   Electronically Signed   By: Bary Richard M.D.   On: 08/02/2014 11:34     EKG Interpretation None      MDM   Final diagnoses:  Abdominal pain, unspecified abdominal location  Biliary colic  Calculus of gallbladder without cholecystitis without obstruction  Gastritis    29 y.o. female with pertinent PMH of recent birth (uncomplicated) presents with epigastric abd pain radiating to back as above.  Exam with epigastric tenderness.  No dysuria, pt has blood from vagina from vaginal delivery with trauma.    Wu with elevated lipase.  CT ordered.  Pt care to Dr. Fayrene Fearing pending CT scan.  I have reviewed all laboratory and imaging studies if ordered as above  1. Abdominal pain, unspecified abdominal location   2. Back pain   3. Cholelithiasis   4. Biliary colic   5. Calculus of gallbladder without cholecystitis without obstruction   6. Gastritis         Mirian Mo, MD 08/02/14 2303

## 2014-08-02 NOTE — ED Notes (Signed)
Dr. James at bedside with patient and family.   

## 2014-08-03 NOTE — ED Provider Notes (Signed)
Care discussed with Dr. Littie Deeds. CT pending. Patient was symptom-free. Minimal elevation of lipase. Not greater than 2 times normal. CT does no signs of pancreatitis or pancreatic edema. Suggest skull or distention. 2 stones. Questionable signs of cholecystitis. I discussed this at length with Miranda Hensley. She remained essentially symptom-free. Minimal back pain. Ultrasound obtained. Confirm stones. No sign of ductal dilatation, or close cystic fluid, or any signs of acute cholecystitis. Negative sonographic Murphy sign.  On discharge and recommended a proton pump inhibitor and surgical follow-up. She was asked to recheck here if she has any symptoms not relieved fairly quickly with simple measures at home. Particularly with jaundice fever worsening pain or any other abnormalities. May require cholecystectomy. May need either scan. Given surgical referral. Discharged with Prilosec, Zofran, Vicodin prescriptions.  Rolland Porter, MD 08/03/14 [phone]

## 2014-08-09 ENCOUNTER — Encounter: Payer: Self-pay | Admitting: Vascular Surgery

## 2014-08-09 ENCOUNTER — Other Ambulatory Visit: Payer: Self-pay | Admitting: *Deleted

## 2014-08-09 DIAGNOSIS — I83893 Varicose veins of bilateral lower extremities with other complications: Secondary | ICD-10-CM

## 2014-08-14 ENCOUNTER — Encounter (HOSPITAL_COMMUNITY): Payer: BLUE CROSS/BLUE SHIELD

## 2014-08-14 ENCOUNTER — Encounter: Payer: BLUE CROSS/BLUE SHIELD | Admitting: Vascular Surgery

## 2015-07-13 ENCOUNTER — Encounter: Payer: Self-pay | Admitting: Vascular Surgery

## 2015-07-24 ENCOUNTER — Ambulatory Visit (HOSPITAL_COMMUNITY)
Admission: RE | Admit: 2015-07-24 | Discharge: 2015-07-24 | Disposition: A | Discharge status: Home / Self Care | Payer: 59 | Source: Ambulatory Visit | Attending: Vascular Surgery | Admitting: Vascular Surgery

## 2015-07-24 ENCOUNTER — Encounter: Payer: Self-pay | Admitting: Vascular Surgery

## 2015-07-24 ENCOUNTER — Ambulatory Visit (INDEPENDENT_AMBULATORY_CARE_PROVIDER_SITE_OTHER): Payer: 59 | Admitting: Vascular Surgery

## 2015-07-24 VITALS — BP 104/63 | HR 78 | Temp 97.7°F | Resp 20 | Ht 64.0 in | Wt 146.0 lb

## 2015-07-24 DIAGNOSIS — I83893 Varicose veins of bilateral lower extremities with other complications: Secondary | ICD-10-CM | POA: Insufficient documentation

## 2015-07-24 DIAGNOSIS — R609 Edema, unspecified: Secondary | ICD-10-CM | POA: Diagnosis present

## 2015-07-24 NOTE — Progress Notes (Signed)
Subjective:     Patient ID: Miranda Hensley, female   DOB: [birth year], 30 y.o.   MRN: [id number]  HPI this 30 year old female was referred for evaluation of bilateral varicose veins. The patient noticed prominent veins in both legs after her first pregnancy. Her second pregnancy was in 2016 and during that time. She developed some prominent veins in the vaginal area as well as in the posterior thigh and calf. Most of that resolved except for some prominent veins behind the thigh and calf. She has stinging and itching discomfort in these areas. She states that after walking moderate distances she will develop some aching in stinging discomfort in both legs. She does occasionally have some swelling in both ankles. She works in her feet frequently. Chest history of DVT thrombophlebitis stasis ulcers or bleeding.  Past Medical History  Diagnosis Date  . No pertinent past medical history   . Newborn product of IVF pregnancy   . Hx of varicella   . Varicose veins   . Medical history non-contributory     Social History  Substance Use Topics  . Smoking status: Never Smoker   . Smokeless tobacco: Not on file  . Alcohol Use: No    Family History  Problem Relation Age of Onset  . Heart disease Mother   . Hypertension Mother   . Anemia Mother   . Diabetes Mother   . Heart disease Brother   . Hypertension Brother   . Thyroid disease Maternal Grandmother   . Cancer Maternal Grandmother     breast  . Peripheral vascular disease Maternal Grandmother   . Hypertension Maternal Grandfather   . Heart failure Maternal Grandfather     Allergies  Allergen Reactions  . Adhesive [Tape] Dermatitis    blisters  . Cephalexin Hives     Current outpatient prescriptions:  .  calcium carbonate (TUMS - DOSED IN MG ELEMENTAL CALCIUM) 500 MG chewable tablet, Chew 2 tablets by mouth as needed for indigestion or heartburn. Reported on 07/24/2015, Disp: , Rfl:  .  HYDROcodone-acetaminophen (NORCO/VICODIN)  5-325 MG per tablet, Take 2 tablets by mouth every 4 (four) hours as needed. (Patient not taking: Reported on 07/24/2015), Disp: 10 tablet, Rfl: 0 .  omeprazole (PRILOSEC) 20 MG capsule, Take 1 capsule (20 mg total) by mouth 2 (two) times daily. (Patient not taking: Reported on 07/24/2015), Disp: 60 capsule, Rfl: 1 .  ondansetron (ZOFRAN ODT) 4 MG disintegrating tablet, Take 1 tablet (4 mg total) by mouth every 8 (eight) hours as needed for nausea. (Patient not taking: Reported on 07/24/2015), Disp: 20 tablet, Rfl: 0 .  oxyCODONE-acetaminophen (PERCOCET/ROXICET) 5-325 MG per tablet, Take 2 tablets by mouth every 4 (four) hours as needed (for pain scale greater than 7). (Patient not taking: Reported on 08/02/2014), Disp: 30 tablet, Rfl: 0 .  Prenatal Vit-Fe Fumarate-FA (PRENATAL MULTIVITAMIN) TABS, Take 1 tablet by mouth daily. Reported on 07/24/2015, Disp: , Rfl:   Filed Vitals:   07/24/15 1223  BP: 104/63  Pulse: 78  Temp: 97.7 F (36.5 C)  TempSrc: Oral  Resp: 20  Height:  (1.626 m)  Weight: 146 lb (66.225 kg)    Body mass index is 25.05 kg/(m^2).           Review of Systems last chest pain, dyspnea on exertion, PND, orthopnea, hemoptysis     Objective:   Physical Exam BP 104/63 mmHg  Pulse 78  Temp(Src) 97.7 F (36.5 C) (Oral)  Resp 20  Ht  (1.626  m)  Wt 146 lb (66.225 kg)  BMI 25.05 kg/m2    Gen.-alert and oriented x3 in no apparent distress HEENT normal for age Lungs no rhonchi or wheezing Cardiovascular regular rhythm no murmurs carotid pulses 3+ palpable no bruits audible Abdomen soft nontender no palpable masses Musculoskeletal free of  major deformities Skin clear -no rashes Neurologic normal Lower extremities 3+ femoral and dorsalis pedis pulses palpable bilaterally with no edema Both legs have some prominent veins in the mid thigh extending down to the popliteal area. Right leg has some prominent reticular veins and one small area laterally. There  is no hyperpigmentation, ulceration, bulging varicosities, or significant edema on physical exam.  Today I ordered bilateral venous duplex exam which I reviewed and interpreted. It is a totally normal exam with no DVT or significant reflux in the deep or superficial systems.       Assessment:     Bilateral spider veins with no evidence of significant reflux in the superficial or deep venous system    Plan:     Discussed with patient that foam sclerotherapy would be the treatment option if she decided to have this performed She will consider this and if she would like to proceed further she will give lives [facility] a call to further discuss this

## 2015-12-28 ENCOUNTER — Other Ambulatory Visit: Payer: Self-pay | Admitting: Obstetrics

## 2015-12-31 LAB — CYTOLOGY - PAP

## 2016-01-02 IMAGING — US US OB TRANSVAGINAL
1 series · 13 of 28 positions shown · non-contrast
Comparison: None.

CLINICAL DATA: Pelvic and left shoulder pain. Patient recently
underwent in vitro fertilization with 2 embryos. She is pregnant
with a single intrauterine pregnancy, with an assigned gestational
age of 7 weeks 3 days. She had a pelvic ultrasound in her
gynecologist office on 11/25/2013. These images are not available
for review.

EXAM:
OBSTETRIC <14 WK US AND TRANSVAGINAL OB US
TECHNIQUE: Both transabdominal and transvaginal ultrasound examinations were
performed for complete evaluation of the gestation as well as the
maternal uterus, adnexal regions, and pelvic cul-de-sac.
Transvaginal technique was performed to assess early pregnancy.

[Series 1: us ob comp less 14 wks · 48 acquisitions, 13 frames shown]
[im 2/48]
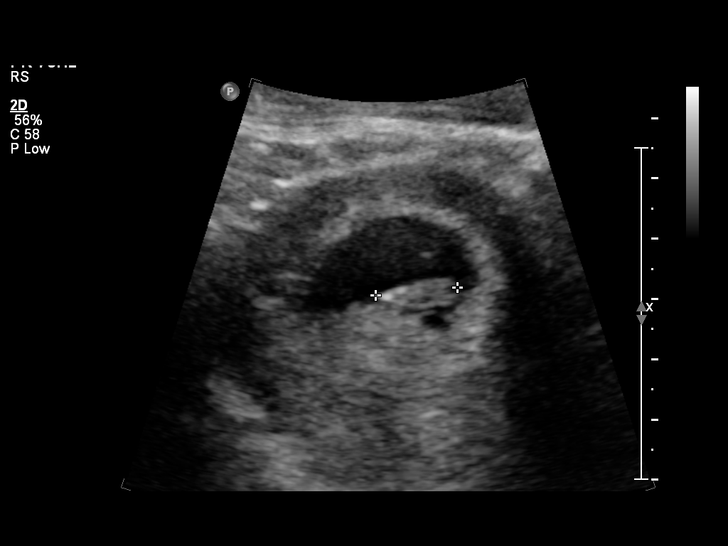
[im 6/48]
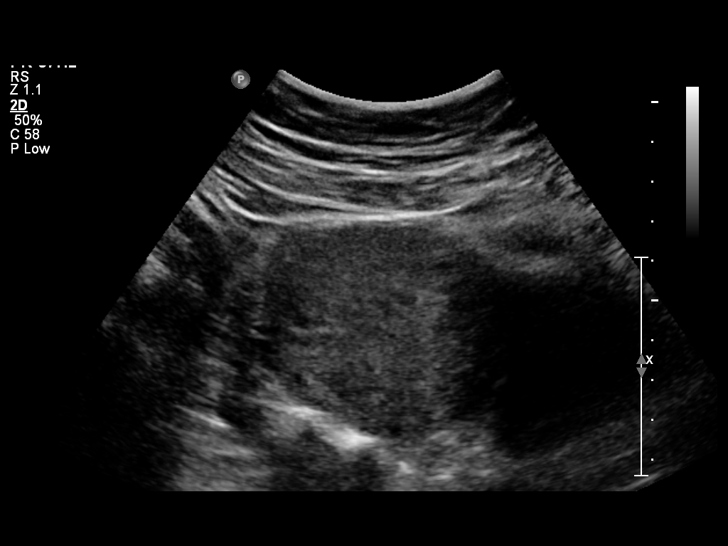
[im 9/48]
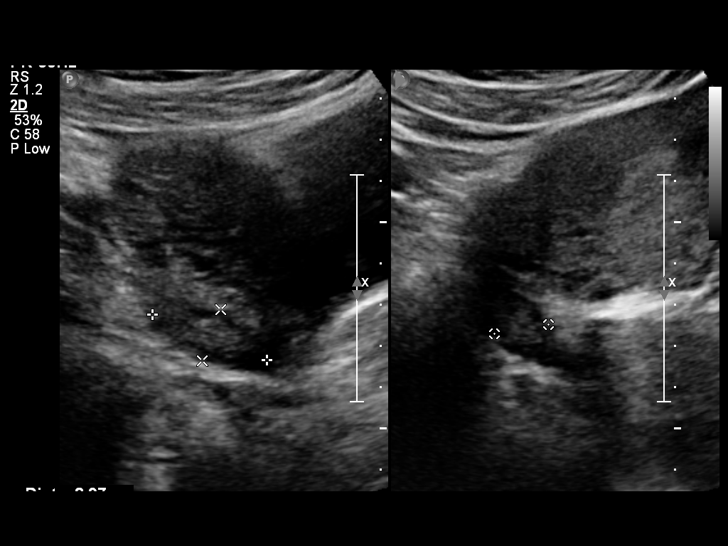
[im 13/48]
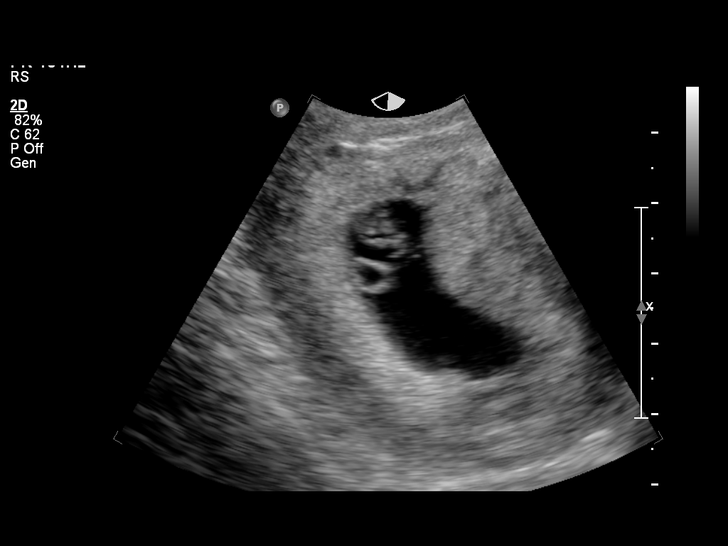
[im 16/48]
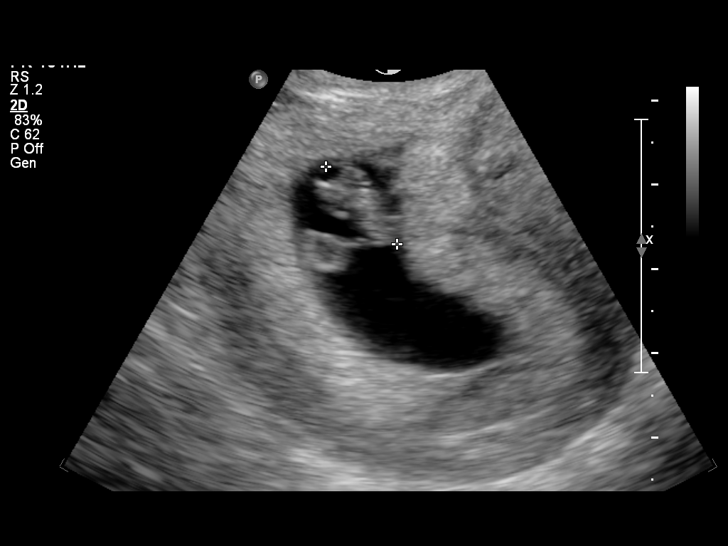
[im 20/48]
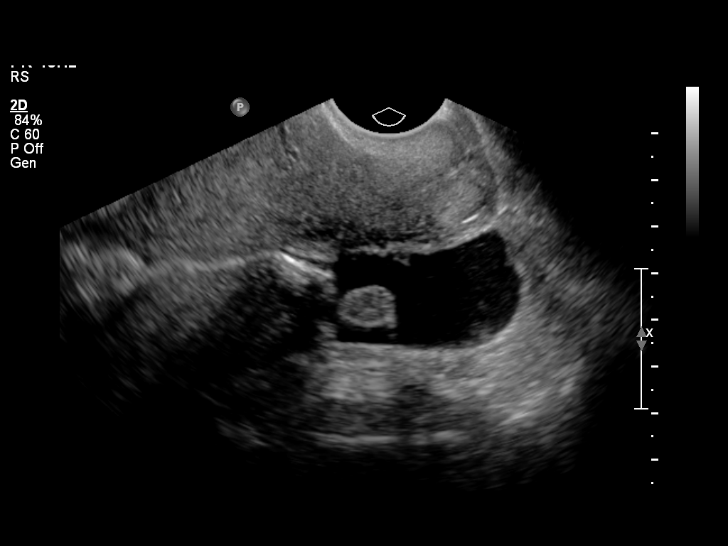
[im 25/48]
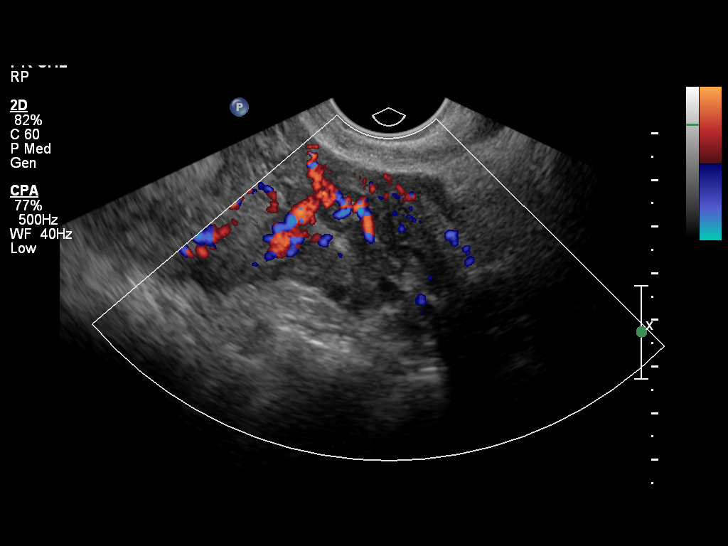
[im 28/48]
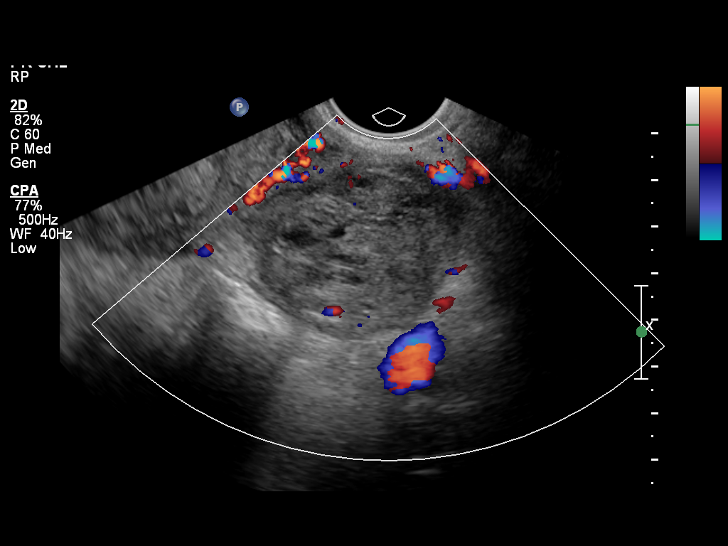
[im 32/48]
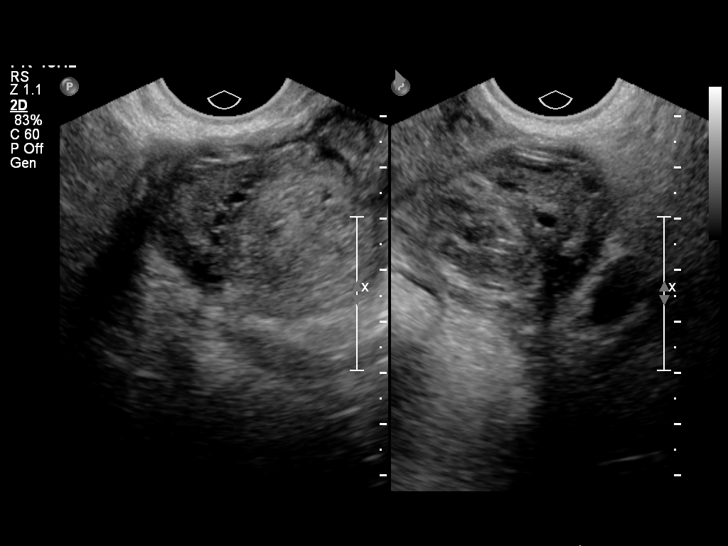
[im 35/48]
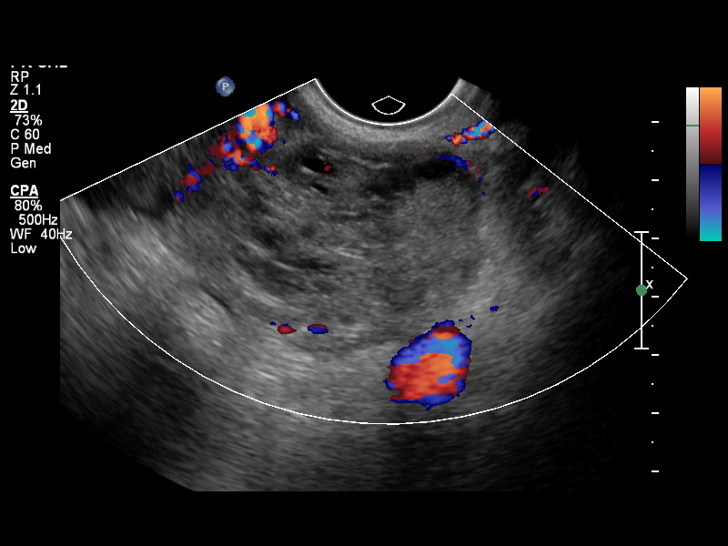
[im 39/48]
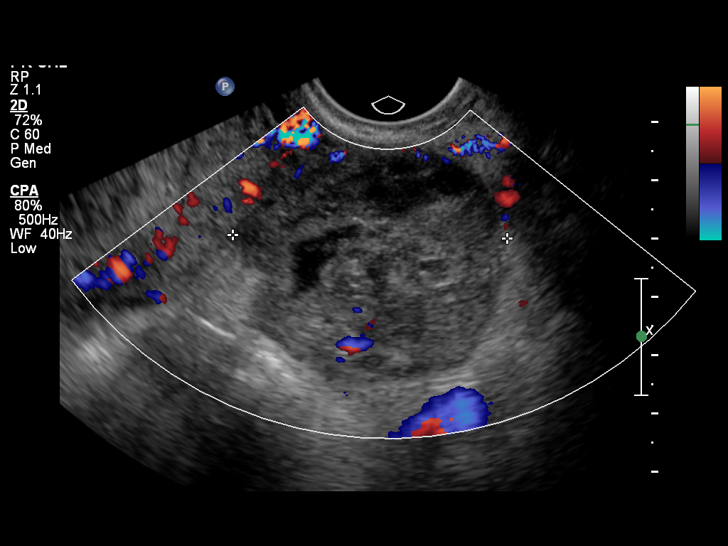
[im 42/48]
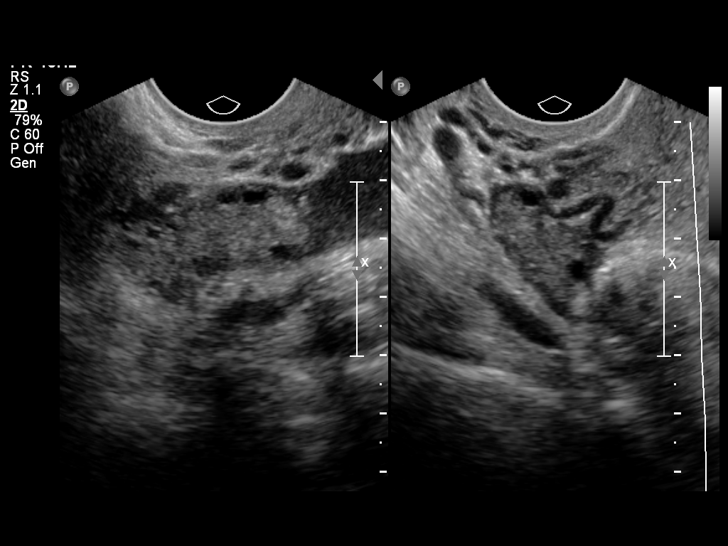
[im 46/48]
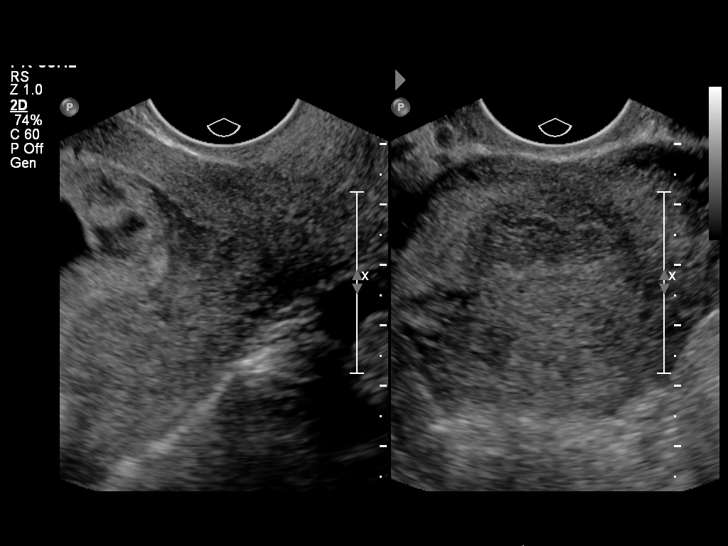

[13 of 28 positions shown; findings below may reference images not displayed]

FINDINGS: Intrauterine gestational sac: Single intrauterine gestational sac is
visualized.

Yolk sac:  Visualized

Embryo:  Visualized

Cardiac Activity: Yes

Heart Rate:  162 bpm

CRL:   12.4  mm   7 w 4 d

Maternal uterus/adnexae: A small focal subchorionic hemorrhage is
noted inferiorly, measuring 1.5 x 1.8 x 1.1. And normal right ovary
measures 3.3 x 1.6 x 2.1 cm. No right adnexal mass seen.

The left ovary is visualized and measures 2.8 x 1.8 x 1.6 cm.
Surrounding the left ovary is a large heterogeneous left adnexal
mass with soft tissue density and fluid density that measures 7.3 x
4.4 x 4.7 cm. No definite vascular flow seen within this left
adnexal mass. Additionally, there is complex free fluid in the
cul-de-sac and right adnexa, small the moderate in amount.
IMPRESSION: 1. Imaging findings suggest a heterotopic pregnancy.
2. There is a single living intrauterine gestation with crown-rump
length measurements concordant with assigned gestational age of 7
weeks 3 days.
3. Complex 7.3 x 4.4 x 4.7 cm left adnexal mass surrounding the left
ovary and small a moderate amount of complex free pelvic fluid.
Findings are suspicious for a left ectopic pregnancy. The
heterogeneous, predominately avascular mass could in part be due to
hemorrhage/clot, and a ruptured ectopic pregnancy cannot be
excluded.
Critical Value/emergent results were called by telephone at the time
of interpretation on 11/26/2013 at [DATE] to Dr. EMMANUEL KELVIN JESSEY ,
who verbally acknowledged these results.

## 2016-03-01 IMAGING — US US OB DETAIL+14 WK
1 series · 12 of 28 positions shown · non-contrast
Comparison: none

[Series 1: us ob detail+14 wk · 0.06mm/px · 12 of 72 slices shown]
[im 3/72]
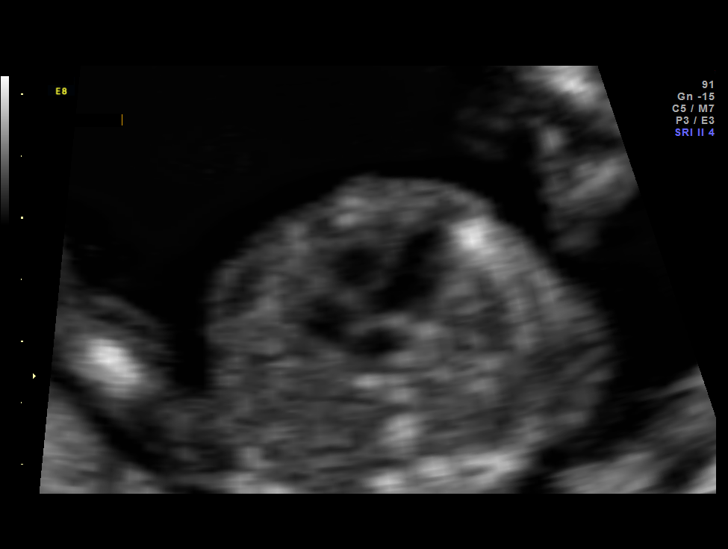
[im 8/72]
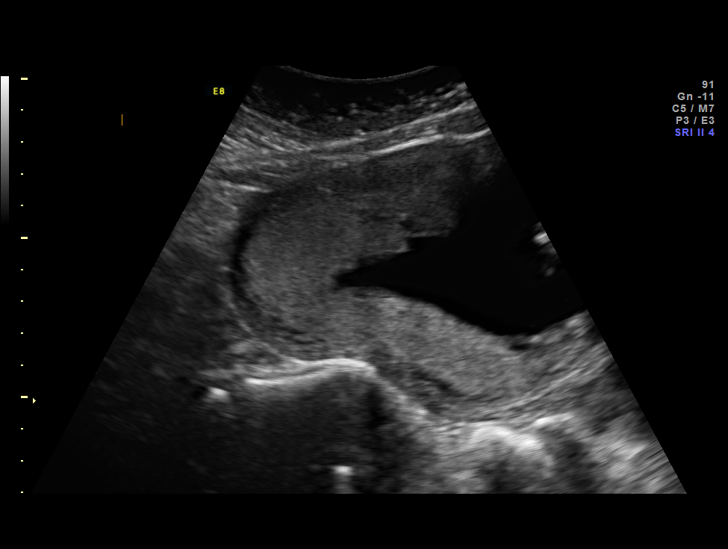
[im 14/72]
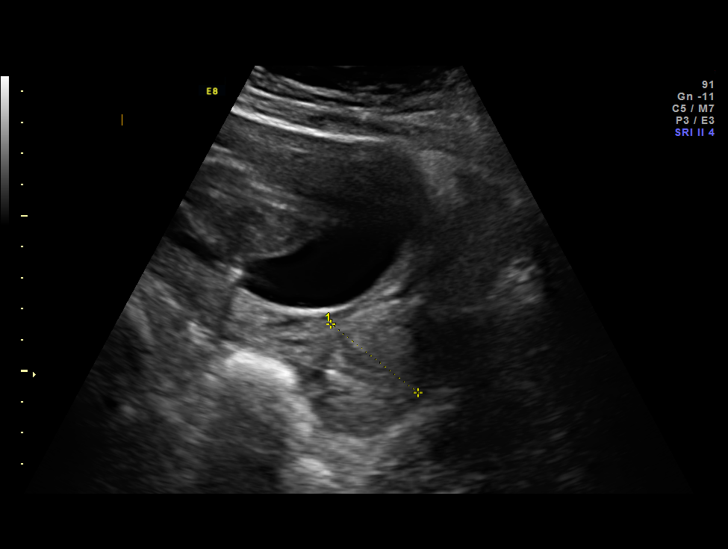
[im 22/72]
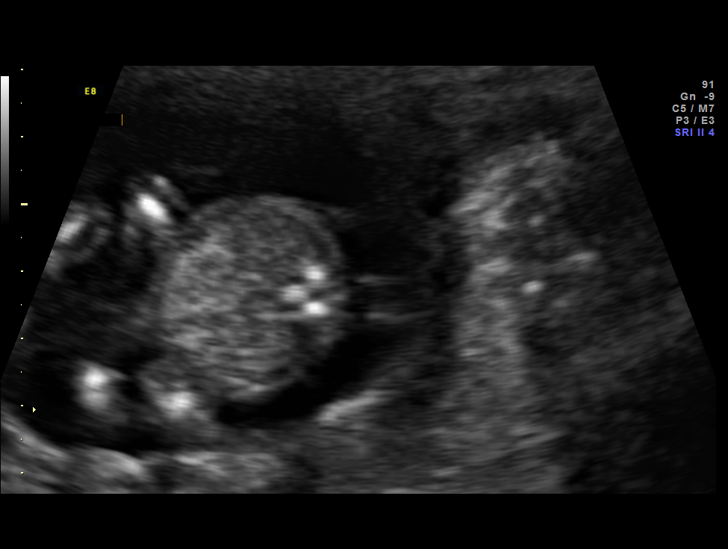
[im 27/72]
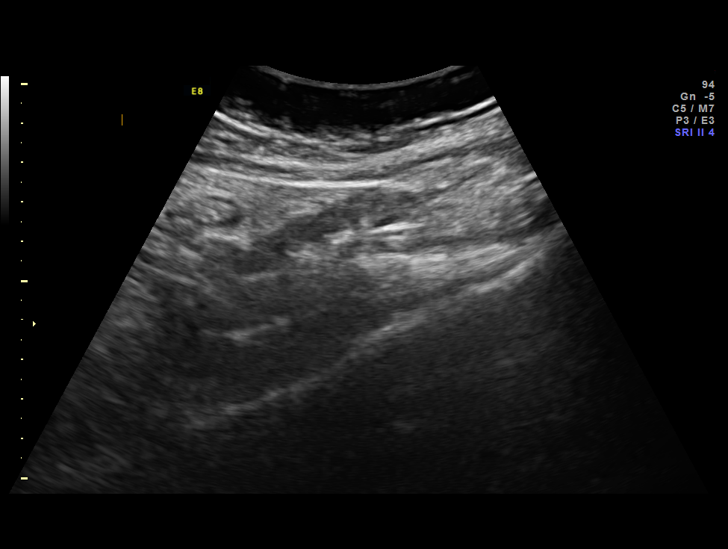
[im 32/72]
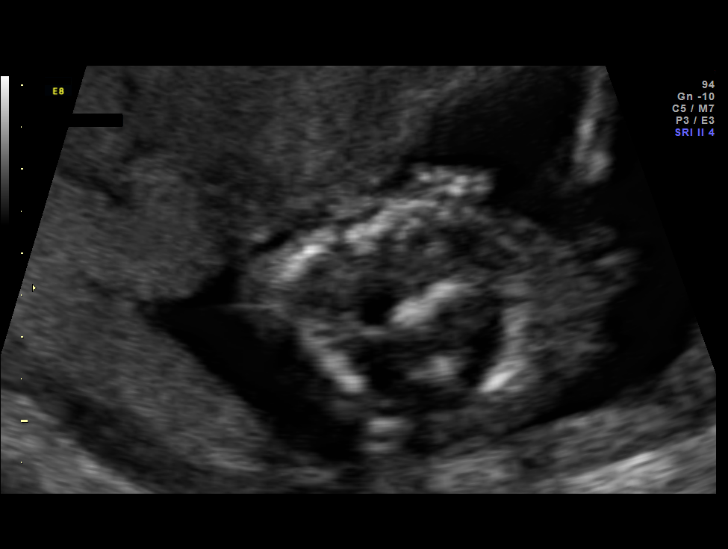
[im 40/72]
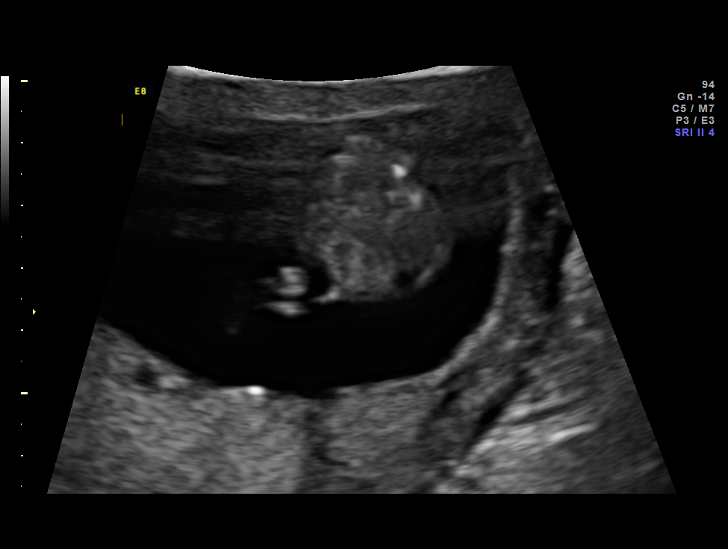
[im 45/72]
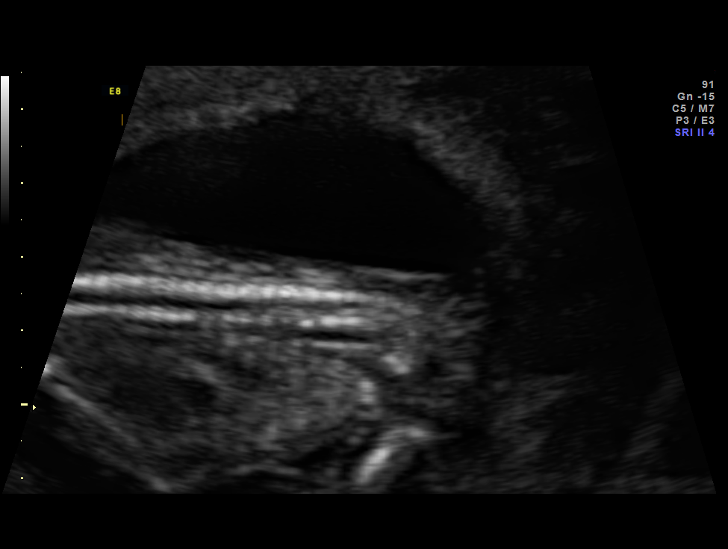
[im 50/72]
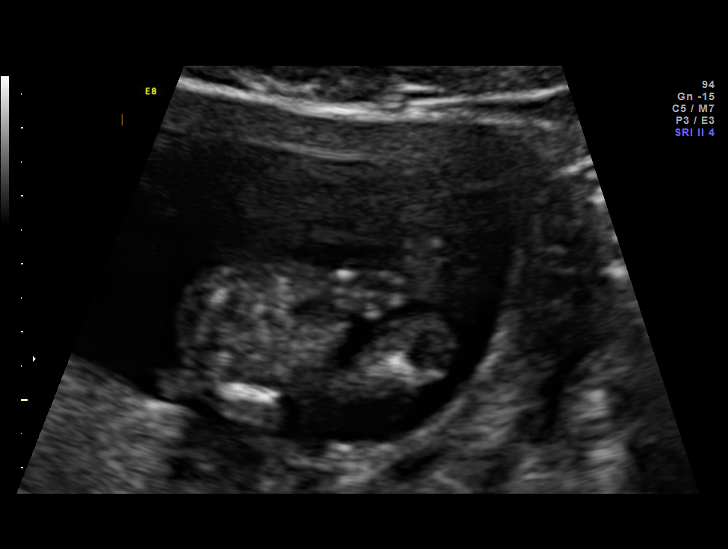
[im 58/72]
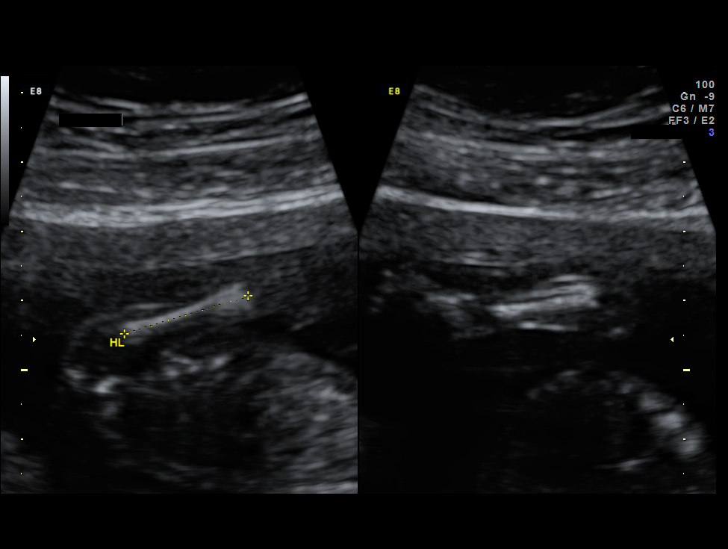
[im 64/72]
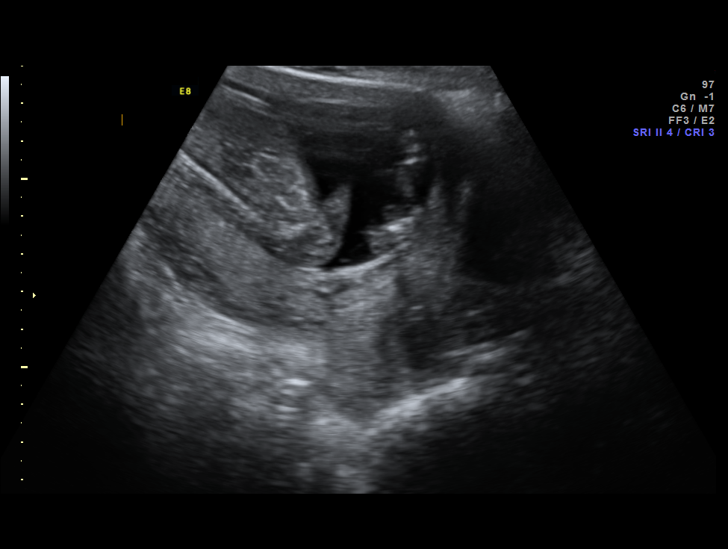
[im 69/72]
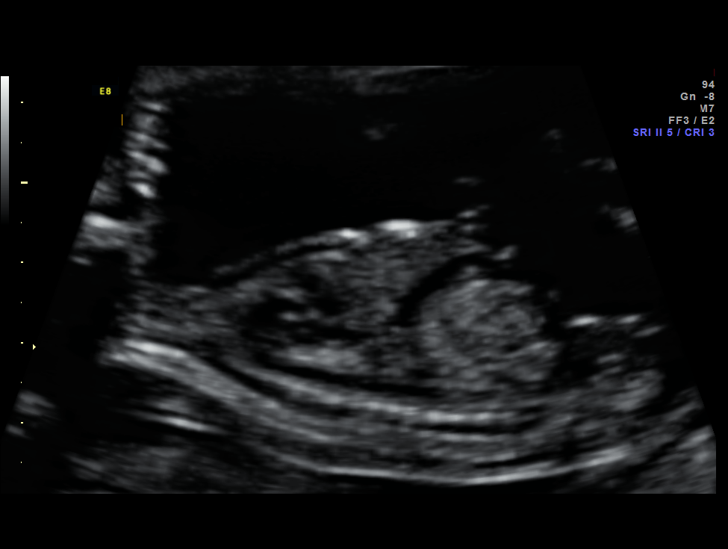

[12 of 28 positions shown; findings below may reference images not displayed]

OBSTETRICS REPORT
                      (Signed Final 01/24/2014 [DATE])

Service(s) Provided

 US OB DETAIL + 14 WK                                  76811.0
Indications

 15 weeks gestation of pregnancy
 Detailed fetal anatomic survey                        Z36
 Elevated MSAFP (2.61 MoM) - OSBR [DATE]
 Pregnancy resulting from assisted reproductive
 technology (IVF)
Fetal Evaluation

 Num Of Fetuses:    1
 Fetal Heart Rate:  147                          bpm
 Cardiac Activity:  Observed
 Presentation:      Breech
 Placenta:          Right lateral, marginal
                    previa
 P. Cord            Marginal insertion
 Insertion:

 Amniotic Fluid
 AFI FV:      Subjectively within normal limits
                                             Larg Pckt:     5.1  cm
Biometry

 BPD:       35  mm     G. Age:  16w 6d                CI:         83.5   70 - 86
 OFD:     41.9  mm                                    FL/HC:      15.4   13.3 -

 HC:     123.4  mm     G. Age:  16w 1d       53  %    HC/AC:      1.22   1.05 -

 AC:     101.5  mm     G. Age:  16w 1d       62  %    FL/BPD:
 FL:        19  mm     G. Age:  15w 4d       37  %    FL/AC:      18.7   20 - 24
 HUM:     18.8  mm     G. Age:  15w 4d       41  %
 CER:     17.1  mm     G. Age:  17w 1d       78  %
 NFT:        3  mm

 Est. FW:     140  gm      0 lb 5 oz     71  %
Gestational Age

 U/S Today:     16w 1d                                        EDD:   07/10/14
 Best:          15w 6d     Det. By:  D.O. Conception          EDD:   07/12/14
                                     (10/19/13)
Anatomy

 Cranium:          Appears normal         Aortic Arch:      Not well visualized
 Fetal Cavum:      Appears normal         Ductal Arch:      Not well visualized
 Ventricles:       Appears normal         Diaphragm:        Not well visualized
 Choroid Plexus:   Appears normal         Stomach:          Appears normal, left
                                                            sided
 Cerebellum:       Appears normal         Abdomen:          Appears normal
 Posterior Fossa:  Appears normal         Abdominal Wall:   Appears nml (cord
                                                            insert, abd wall)
 Nuchal Fold:      Appears normal         Cord Vessels:     Appears normal (3
                                                            vessel cord)
 Face:             Not well visualized    Kidneys:          Not well visualized
 Lips:             Not well visualized    Bladder:          Appears normal
 Palate:           Not well visualized    Spine:            Not well visualized
 Heart:            Not well visualized    Lower             Appears normal
                                          Extremities:
 RVOT:             Not well visualized    Upper             Appears normal
                                          Extremities:
 LVOT:             Not well visualized

 Other:  Fetus appears to be a female. Heels and 5th digit visualized. Nasal
         bone visualized. Technically difficult due to early gestational age.
Targeted Anatomy

 Fetal Central Nervous System
 Cisterna Magna:
Cervix Uterus Adnexa

 Cervical Length:    3.5      cm

 Cervix:       Normal appearance by transabdominal scan.
 Left Ovary:    Not visualized.
 Right Ovary:   Within normal limits.

 Adnexa:     No abnormality visualized.
Impression

 SIUP at 27w1d
 elevated msAFP, low risk NIPS
 Size is appropriate for dates
 No dysmorphic features, limited as above
 marginal previa
Recommendations

 1. see genetic counseling
 2. complete anatomy in 2 weeks (at 
18 weeks); interval
 growth q6 weeks thereafter.

 questions or concerns.

## 2016-03-11 ENCOUNTER — Encounter: Payer: Self-pay | Admitting: *Deleted

## 2016-03-19 ENCOUNTER — Ambulatory Visit (INDEPENDENT_AMBULATORY_CARE_PROVIDER_SITE_OTHER): Payer: Self-pay | Admitting: *Deleted

## 2016-03-19 DIAGNOSIS — I83893 Varicose veins of bilateral lower extremities with other complications: Secondary | ICD-10-CM

## 2016-03-19 NOTE — Progress Notes (Signed)
X=.3% Sotradecol administered with a 27g butterfly.  Patient received a total of 12cc.  Pt had a large reticular that went from her left  calf up the back of her thigh and then also on the front of the thigh. Easy access. Tol pretty well. Anticipate good results. Did c/o stinging. Follow prn.    Compression stockings applied: Yes.

## 2016-03-25 ENCOUNTER — Encounter: Payer: Self-pay | Admitting: *Deleted

## 2016-04-16 ENCOUNTER — Ambulatory Visit: Payer: 59

## 2016-09-07 IMAGING — CT CT ABD-PELV W/ CM
2 of 4 series · 16 of 46 positions shown, 18 images · IV contrast (Omni 300)
Comparison: None.

CLINICAL DATA: Mid back pain and upper abdominal pain, emesis.

EXAM:
CT ABDOMEN AND PELVIS WITH CONTRAST
TECHNIQUE: Multidetector CT imaging of the abdomen and pelvis was performed
using the standard protocol following bolus administration of
intravenous contrast.
CONTRAST:  100mL OMNIPAQUE IOHEXOL 300 MG/ML  SOLN

[Series 2: abd/ pelvis 5.0 i30f 1 · axial · 0.81mm/px · z∈[+1092,+1512]mm · 13 of 92 slices shown, 15 images]
[im 4/92  soft-tissue]
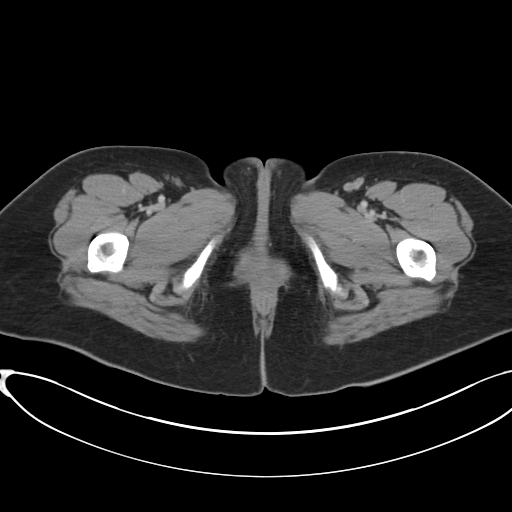
[im 4/92  bone]
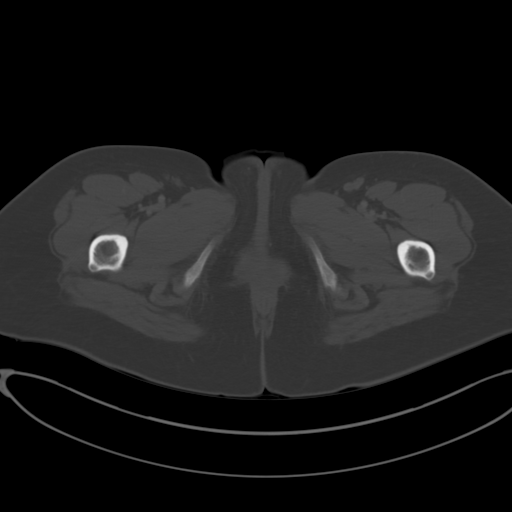
[im 12/92  soft-tissue]
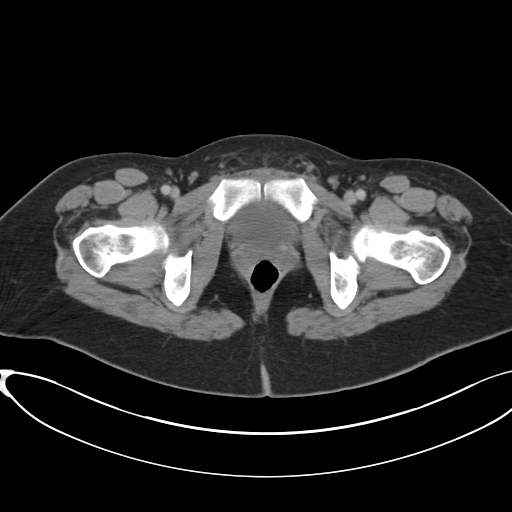
[im 20/92  soft-tissue]
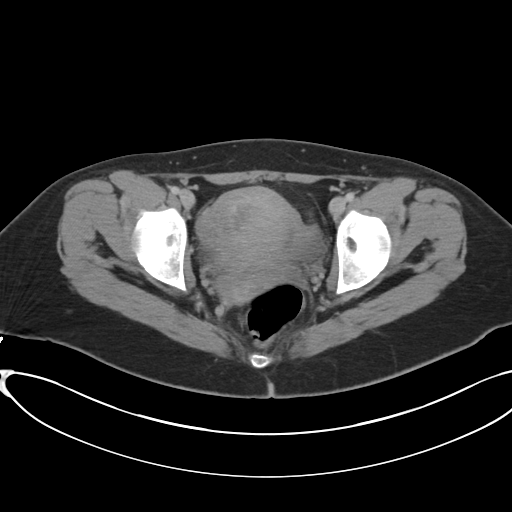
[im 24/92  soft-tissue]
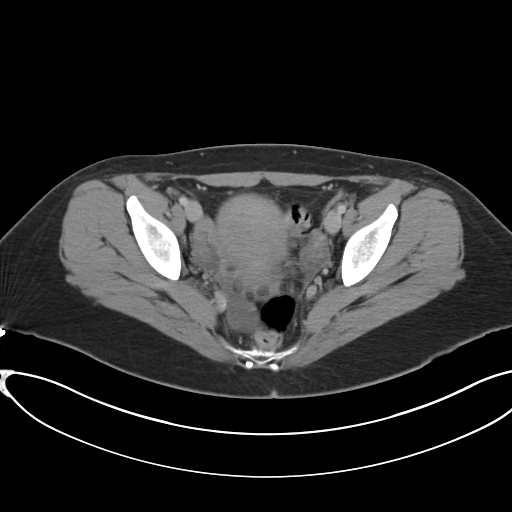
[im 32/92  soft-tissue]
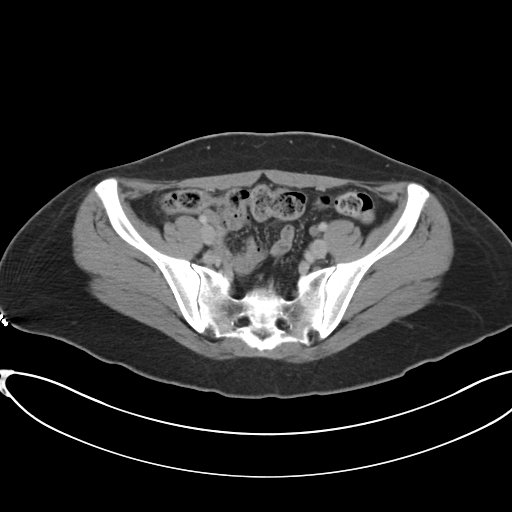
[im 40/92  soft-tissue]
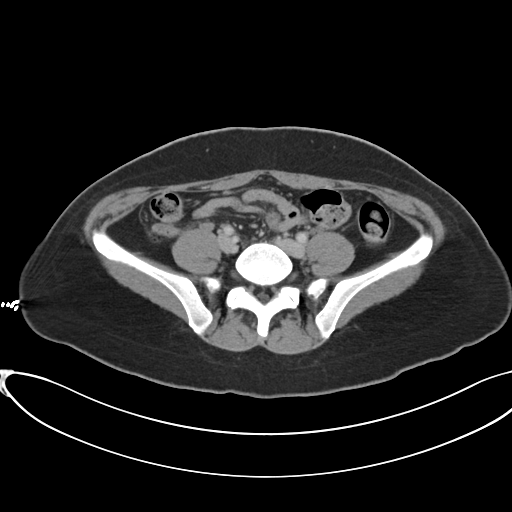
[im 48/92  soft-tissue]
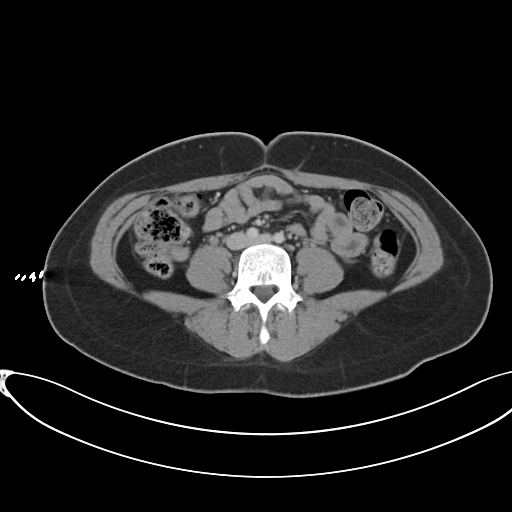
[im 52/92  soft-tissue]
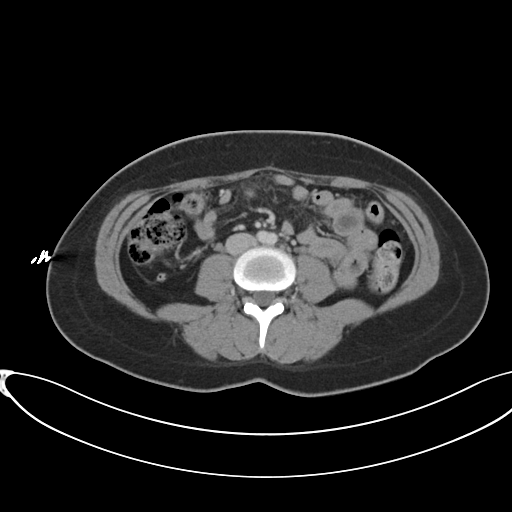
[im 60/92  soft-tissue]
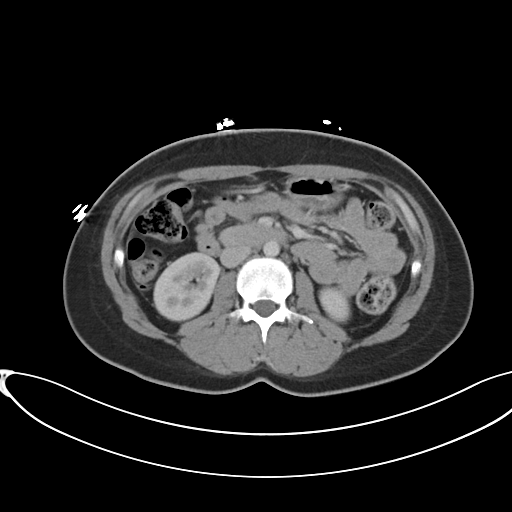
[im 60/92  bone]
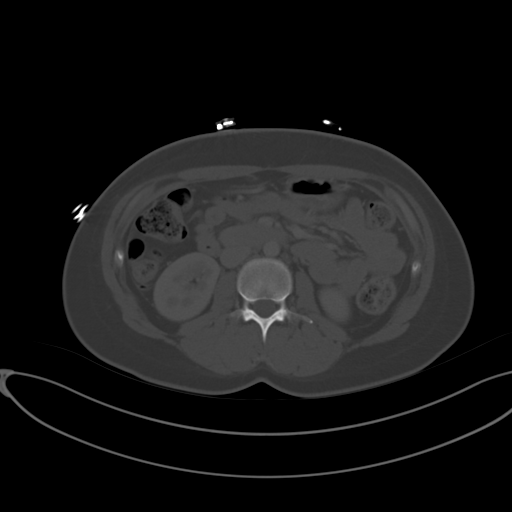
[im 68/92  soft-tissue]
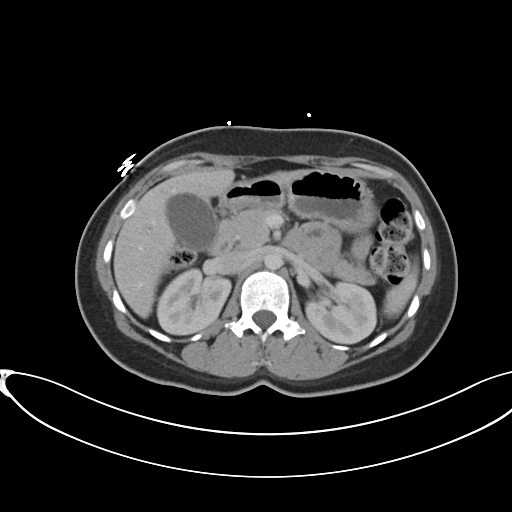
[im 72/92  soft-tissue]
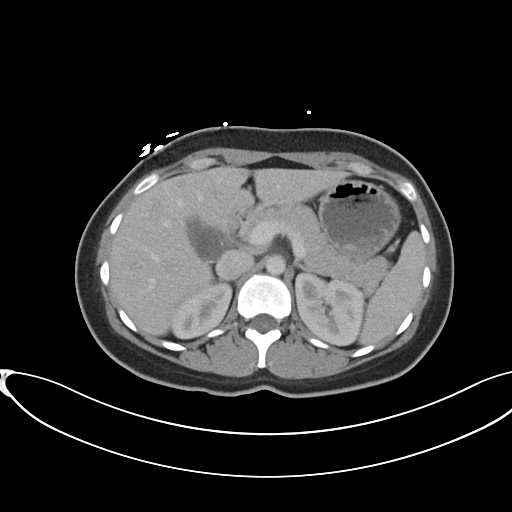
[im 80/92  soft-tissue]
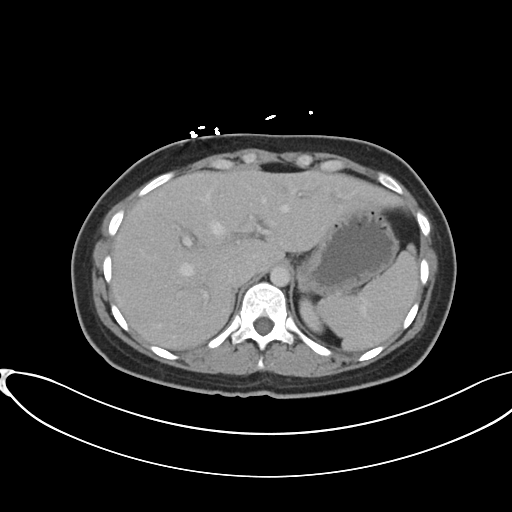
[im 88/92  soft-tissue]
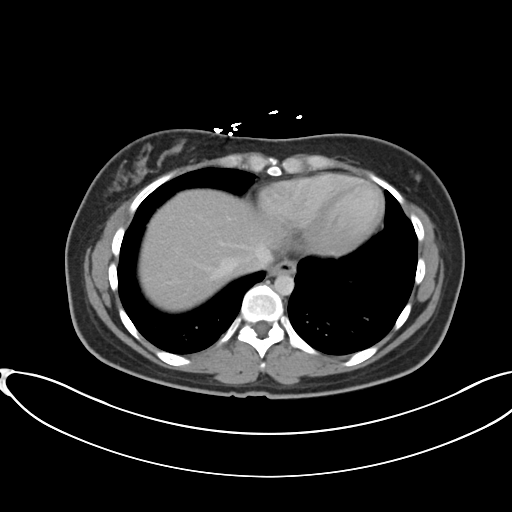

[Series 5: coronals · coronal · 0.70mm/px · 3 of 109 slices shown]
[im 37/109  soft-tissue]
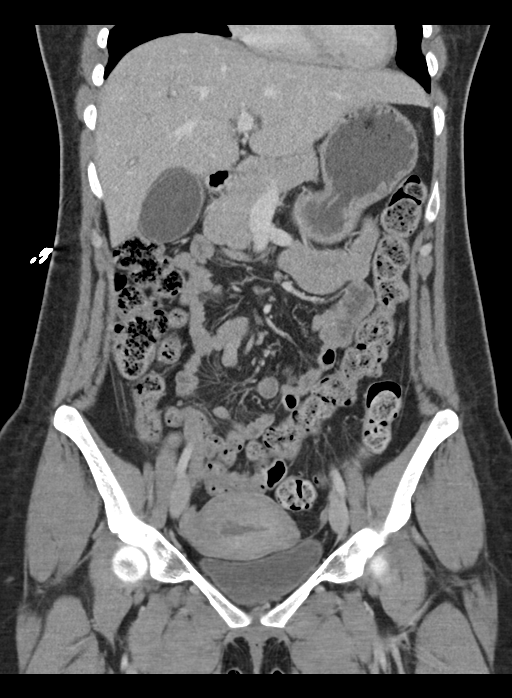
[im 49/109  soft-tissue]
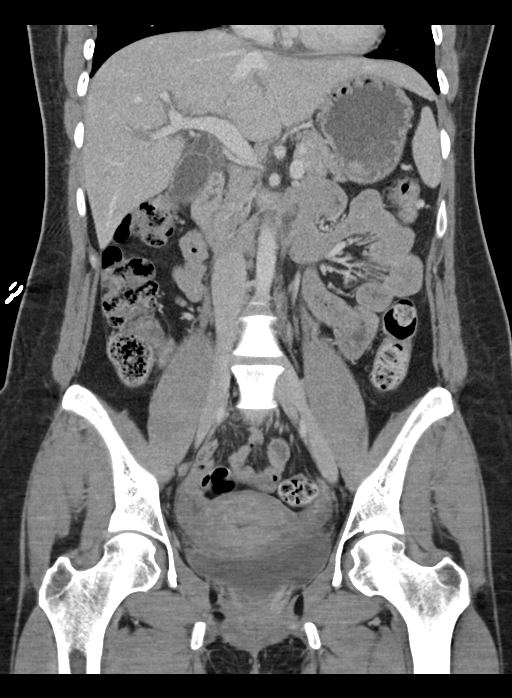
[im 61/109  soft-tissue]
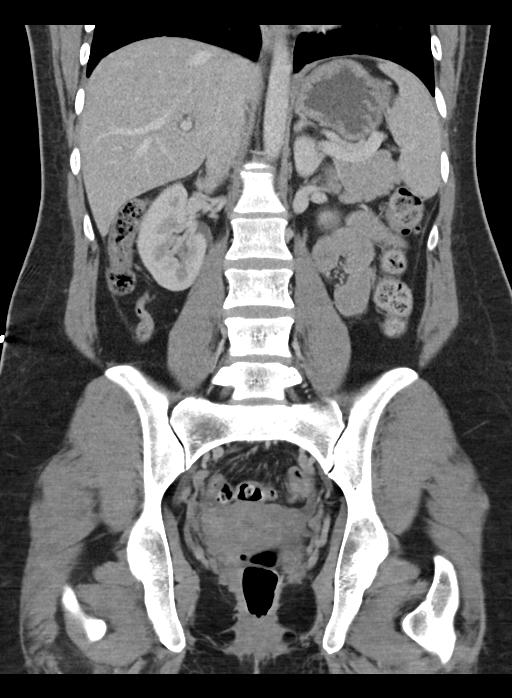

[16 of 46 positions shown; findings below may reference images not displayed]

FINDINGS: Gallbladder is mildly distended and contains at least 2 small
stones. There is questionable pericholecystic fluid and periportal
edema. No significant extrahepatic bile duct dilatation identified,
however, there is a focus of subtle increased density within the
distal portion of the common bile duct that may represent a small
stone or sludge (image 29).

No focal mass or lesion identified within the liver parenchyma.
Spleen, pancreas, and adrenal glands are normal. Kidneys appear
normal without stone or hydronephrosis.

Bowel is normal in caliber. No bowel wall thickening or evidence of
bowel wall inflammation. Moderate amount of stool within the
nondistended colon. Appendix is normal. Trace free fluid in the
lower pelvis is likely physiologic in nature. Adnexal regions are
unremarkable. No enlarged lymph nodes seen. No free intraperitoneal
air.

Abdominal aorta is normal in caliber. Lung bases are clear. No
osseous abnormality.
IMPRESSION: Gallbladder is mildly distended containing at least 2 small stones.
No definite gallbladder wall thickening but questionable
pericholecystic fluid/ edema. If any localizable right upper
quadrant pain, would consider right upper quadrant ultrasound to
exclude cholecystitis.

No common bile duct dilatation but subtle density within the distal
common bile duct is suspicious for nonobstructing bile duct stone or
sludge.

Remainder of the abdomen and pelvis CT is unremarkable, as detailed
above.
